# Patient Record
Sex: Female | Born: 1985
Health system: Southern US, Community
[De-identification: ages and names within clinical notes are randomized; demographics above are authoritative.]

## PROBLEM LIST (undated history)

## (undated) DIAGNOSIS — F419 Anxiety disorder, unspecified: Secondary | ICD-10-CM

## (undated) DIAGNOSIS — S82209A Unspecified fracture of shaft of unspecified tibia, initial encounter for closed fracture: Secondary | ICD-10-CM

## (undated) DIAGNOSIS — R112 Nausea with vomiting, unspecified: Secondary | ICD-10-CM

## (undated) DIAGNOSIS — F32A Depression, unspecified: Secondary | ICD-10-CM

## (undated) DIAGNOSIS — F329 Major depressive disorder, single episode, unspecified: Secondary | ICD-10-CM

## (undated) DIAGNOSIS — R2231 Localized swelling, mass and lump, right upper limb: Secondary | ICD-10-CM

## (undated) DIAGNOSIS — Z9889 Other specified postprocedural states: Secondary | ICD-10-CM

## (undated) DIAGNOSIS — Z8489 Family history of other specified conditions: Secondary | ICD-10-CM

## (undated) HISTORY — DX: Unspecified fracture of shaft of unspecified tibia, initial encounter for closed fracture: S82.209A

## (undated) HISTORY — DX: Anxiety disorder, unspecified: F41.9

## (undated) HISTORY — DX: Depression, unspecified: F32.A

## (undated) HISTORY — PX: WISDOM TOOTH EXTRACTION: SHX21

## (undated) HISTORY — DX: Major depressive disorder, single episode, unspecified: F32.9

---
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
## 2003-11-05 ENCOUNTER — Other Ambulatory Visit: Admission: RE | Admit: 2003-11-05 | Discharge: 2003-11-05 | Payer: Self-pay | Admitting: Gynecology

## 2004-11-10 ENCOUNTER — Other Ambulatory Visit: Admission: RE | Admit: 2004-11-10 | Discharge: 2004-11-10 | Payer: Self-pay | Admitting: Gynecology

## 2004-11-13 ENCOUNTER — Encounter: Admission: RE | Admit: 2004-11-13 | Discharge: 2004-11-13 | Payer: Self-pay | Admitting: Internal Medicine

## 2005-04-24 ENCOUNTER — Other Ambulatory Visit: Admission: RE | Admit: 2005-04-24 | Discharge: 2005-04-24 | Payer: Self-pay | Admitting: Gynecology

## 2005-11-11 ENCOUNTER — Other Ambulatory Visit: Admission: RE | Admit: 2005-11-11 | Discharge: 2005-11-11 | Payer: Self-pay | Admitting: Gynecology

## 2006-11-26 ENCOUNTER — Other Ambulatory Visit: Admission: RE | Admit: 2006-11-26 | Discharge: 2006-11-26 | Payer: Self-pay | Admitting: Gynecology

## 2007-03-18 ENCOUNTER — Encounter: Admission: RE | Admit: 2007-03-18 | Discharge: 2007-03-18 | Payer: Self-pay | Admitting: Internal Medicine

## 2007-12-09 ENCOUNTER — Other Ambulatory Visit: Admission: RE | Admit: 2007-12-09 | Discharge: 2007-12-09 | Payer: Self-pay | Admitting: Gynecology

## 2008-12-13 ENCOUNTER — Ambulatory Visit: Payer: Self-pay | Admitting: Women's Health

## 2008-12-13 ENCOUNTER — Encounter: Payer: Self-pay | Admitting: Women's Health

## 2008-12-13 ENCOUNTER — Other Ambulatory Visit: Admission: RE | Admit: 2008-12-13 | Discharge: 2008-12-13 | Payer: Self-pay | Admitting: Gynecology

## 2009-12-20 ENCOUNTER — Other Ambulatory Visit: Admission: RE | Admit: 2009-12-20 | Discharge: 2009-12-20 | Payer: Self-pay | Admitting: Gynecology

## 2009-12-20 ENCOUNTER — Ambulatory Visit: Payer: Self-pay | Admitting: Women's Health

## 2010-07-23 ENCOUNTER — Ambulatory Visit: Payer: Self-pay | Admitting: Women's Health

## 2011-02-13 ENCOUNTER — Other Ambulatory Visit (HOSPITAL_COMMUNITY)
Admission: RE | Admit: 2011-02-13 | Discharge: 2011-02-13 | Disposition: A | Discharge status: Home / Self Care | Payer: BC Managed Care – PPO | Source: Ambulatory Visit | Attending: Gynecology | Admitting: Gynecology

## 2011-02-13 ENCOUNTER — Other Ambulatory Visit: Payer: Self-pay | Admitting: Women's Health

## 2011-02-13 ENCOUNTER — Encounter (INDEPENDENT_AMBULATORY_CARE_PROVIDER_SITE_OTHER): Payer: BC Managed Care – PPO | Admitting: Women's Health

## 2011-02-13 DIAGNOSIS — Z01419 Encounter for gynecological examination (general) (routine) without abnormal findings: Secondary | ICD-10-CM

## 2011-02-13 DIAGNOSIS — R87810 Cervical high risk human papillomavirus (HPV) DNA test positive: Secondary | ICD-10-CM | POA: Insufficient documentation

## 2011-02-13 DIAGNOSIS — Z124 Encounter for screening for malignant neoplasm of cervix: Secondary | ICD-10-CM | POA: Insufficient documentation

## 2011-03-02 ENCOUNTER — Ambulatory Visit: Payer: BC Managed Care – PPO | Admitting: Gynecology

## 2011-03-05 ENCOUNTER — Other Ambulatory Visit: Payer: Self-pay | Admitting: Gynecology

## 2011-03-05 ENCOUNTER — Ambulatory Visit (INDEPENDENT_AMBULATORY_CARE_PROVIDER_SITE_OTHER): Payer: BC Managed Care – PPO | Admitting: Gynecology

## 2011-03-05 DIAGNOSIS — R87810 Cervical high risk human papillomavirus (HPV) DNA test positive: Secondary | ICD-10-CM

## 2011-03-05 DIAGNOSIS — R87610 Atypical squamous cells of undetermined significance on cytologic smear of cervix (ASC-US): Secondary | ICD-10-CM

## 2011-09-22 DIAGNOSIS — F32A Depression, unspecified: Secondary | ICD-10-CM | POA: Insufficient documentation

## 2011-09-22 DIAGNOSIS — F419 Anxiety disorder, unspecified: Secondary | ICD-10-CM | POA: Insufficient documentation

## 2011-09-22 DIAGNOSIS — F329 Major depressive disorder, single episode, unspecified: Secondary | ICD-10-CM | POA: Insufficient documentation

## 2011-09-22 DIAGNOSIS — IMO0001 Reserved for inherently not codable concepts without codable children: Secondary | ICD-10-CM | POA: Insufficient documentation

## 2011-09-22 DIAGNOSIS — L709 Acne, unspecified: Secondary | ICD-10-CM | POA: Insufficient documentation

## 2011-09-25 ENCOUNTER — Other Ambulatory Visit (HOSPITAL_COMMUNITY)
Admission: RE | Admit: 2011-09-25 | Discharge: 2011-09-25 | Disposition: A | Discharge status: Home / Self Care | Payer: BC Managed Care – PPO | Source: Ambulatory Visit | Attending: Women's Health | Admitting: Women's Health

## 2011-09-25 ENCOUNTER — Encounter: Payer: Self-pay | Admitting: Women's Health

## 2011-09-25 ENCOUNTER — Ambulatory Visit (INDEPENDENT_AMBULATORY_CARE_PROVIDER_SITE_OTHER): Payer: BC Managed Care – PPO | Admitting: Women's Health

## 2011-09-25 DIAGNOSIS — Z01419 Encounter for gynecological examination (general) (routine) without abnormal findings: Secondary | ICD-10-CM | POA: Insufficient documentation

## 2011-09-25 DIAGNOSIS — N870 Mild cervical dysplasia: Secondary | ICD-10-CM

## 2011-09-25 NOTE — Progress Notes (Signed)
  Presents for a Pap followup. Pap March of 2012 was ascus with positive HR HPV, C&B April,2012 showed LGSIL CIN-1. Contracepting on Yaz with negative cultures no complaints.  Exam: External genitalia is within normal limits, speculum exam cervix is pink lesion or discharge, Pap taken and will triage based on results. Bimanual no CMT no adnexal fullness or tenderness.

## 2011-10-23 ENCOUNTER — Ambulatory Visit (INDEPENDENT_AMBULATORY_CARE_PROVIDER_SITE_OTHER): Payer: BC Managed Care – PPO | Admitting: Women's Health

## 2011-10-23 ENCOUNTER — Encounter: Payer: Self-pay | Admitting: Women's Health

## 2011-10-23 VITALS — BP 110/70

## 2011-10-23 DIAGNOSIS — R350 Frequency of micturition: Secondary | ICD-10-CM

## 2011-10-23 DIAGNOSIS — R82998 Other abnormal findings in urine: Secondary | ICD-10-CM

## 2011-10-23 DIAGNOSIS — N390 Urinary tract infection, site not specified: Secondary | ICD-10-CM

## 2011-10-23 MED ORDER — SULFAMETHOXAZOLE-TRIMETHOPRIM 800-160 MG PO TABS: 1.0000 | ORAL_TABLET | Freq: Two times a day (BID) | ORAL | Status: AC

## 2011-10-23 NOTE — Progress Notes (Signed)
Addended by: Landis Martins R on: 10/23/2011 11:30 AM   Modules accepted: Orders

## 2011-10-23 NOTE — Progress Notes (Signed)
Patient ID: Patricia Hogan, female   DOB: 04/01/1986, 25 y.o.   MRN: [id number] Presents with complaints of pain, urgency, pressure with urination and discomfort at end of stream. Symptoms started yesterday. Not sexually active. Denies any discharge or fever.  Exam: No CVAT, external genitalia is within normal limits, speculum exam scant pink discharge- cycle due in several days. Bimanual no CMT or adnexal fullness or tenderness, slight discomfort at the suprapubic area. UA 5-10 wbc's and +2 bacteria  Plan: Septra DS one by mouth twice a day for 3 days, check urine culture, UTI prevention discussed. Will call if no relief of symptoms.

## 2011-11-24 HISTORY — PX: BREAST SURGERY: SHX581

## 2012-04-01 ENCOUNTER — Other Ambulatory Visit (HOSPITAL_COMMUNITY)
Admission: RE | Admit: 2012-04-01 | Discharge: 2012-04-01 | Disposition: A | Discharge status: Home / Self Care | Payer: BC Managed Care – PPO | Source: Ambulatory Visit | Attending: Women's Health | Admitting: Women's Health

## 2012-04-01 ENCOUNTER — Ambulatory Visit (INDEPENDENT_AMBULATORY_CARE_PROVIDER_SITE_OTHER): Payer: BC Managed Care – PPO | Admitting: Women's Health

## 2012-04-01 ENCOUNTER — Encounter: Payer: Self-pay | Admitting: Women's Health

## 2012-04-01 VITALS — BP 118/78 | Ht 64.0 in | Wt 172.0 lb

## 2012-04-01 DIAGNOSIS — E079 Disorder of thyroid, unspecified: Secondary | ICD-10-CM

## 2012-04-01 DIAGNOSIS — Z01419 Encounter for gynecological examination (general) (routine) without abnormal findings: Secondary | ICD-10-CM

## 2012-04-01 DIAGNOSIS — B079 Viral wart, unspecified: Secondary | ICD-10-CM

## 2012-04-01 DIAGNOSIS — Z13220 Encounter for screening for lipoid disorders: Secondary | ICD-10-CM

## 2012-04-01 DIAGNOSIS — IMO0001 Reserved for inherently not codable concepts without codable children: Secondary | ICD-10-CM

## 2012-04-01 DIAGNOSIS — Z309 Encounter for contraceptive management, unspecified: Secondary | ICD-10-CM

## 2012-04-01 DIAGNOSIS — Z833 Family history of diabetes mellitus: Secondary | ICD-10-CM

## 2012-04-01 DIAGNOSIS — N870 Mild cervical dysplasia: Secondary | ICD-10-CM

## 2012-04-01 DIAGNOSIS — Z113 Encounter for screening for infections with a predominantly sexual mode of transmission: Secondary | ICD-10-CM

## 2012-04-01 LAB — CBC WITH DIFFERENTIAL/PLATELET
Basophils Absolute: 0 10*3/uL (ref 0.0–0.1)
Eosinophils Relative: 1 % (ref 0–5)
HCT: 42.2 % (ref 36.0–46.0)
Hemoglobin: 14 g/dL (ref 12.0–15.0)
Lymphocytes Relative: 21 % (ref 12–46)
Lymphs Abs: 1.9 10*3/uL (ref 0.7–4.0)
MCH: 30.4 pg (ref 26.0–34.0)
MCV: 91.5 fL (ref 78.0–100.0)
Monocytes Absolute: 0.6 10*3/uL (ref 0.1–1.0)
Monocytes Relative: 7 % (ref 3–12)
Neutro Abs: 6.3 10*3/uL (ref 1.7–7.7)
Platelets: 359 10*3/uL (ref 150–400)
RBC: 4.61 MIL/uL (ref 3.87–5.11)
WBC: 8.9 10*3/uL (ref 4.0–10.5)

## 2012-04-01 LAB — GLUCOSE, RANDOM: Glucose, Bld: 82 mg/dL (ref 70–99)

## 2012-04-01 LAB — LIPID PANEL
Cholesterol: 223 mg/dL — ABNORMAL HIGH (ref 0–200)
LDL Cholesterol: 128 mg/dL — ABNORMAL HIGH (ref 0–99)
Triglycerides: 150 mg/dL — ABNORMAL HIGH (ref <150)
VLDL: 30 mg/dL (ref 0–40)

## 2012-04-01 LAB — RPR

## 2012-04-01 LAB — HEPATITIS B SURFACE ANTIGEN: Hepatitis B Surface Ag: NEGATIVE

## 2012-04-01 LAB — HIV ANTIBODY (ROUTINE TESTING W REFLEX): HIV: NONREACTIVE

## 2012-04-01 LAB — TSH: TSH: 1.578 u[IU]/mL (ref 0.350–4.500)

## 2012-04-01 MED ORDER — DROSPIRENONE-ETHINYL ESTRADIOL 3-0.02 MG PO TABS: 1.0000 | ORAL_TABLET | Freq: Every day | ORAL | Status: DC

## 2012-04-01 MED ORDER — IMIQUIMOD 5 % EX CREA: TOPICAL_CREAM | CUTANEOUS | Status: AC

## 2012-04-01 NOTE — Progress Notes (Signed)
Patricia Hogan 10-17-1986 [id number]    History:    The patient presents for annual exam.  Light 3-4 day monthly cycle on Yaz. Not sexually active for greater than 6 months. Gardasil series completed. History of ascus with positive high risk HPV, C&B showed LGSIL CIN-1 mild dysplasia 02/2011, Paps prior all normal. History of a left breast ruptured implant that was replaced in 2012. Currently on Wellbutrin and Effexor per psychiatrist for anxiety and depression. Complaint of weight gain but has  lost 7 pounds in the past year. Requesting an STD screen, questionable HIV exposure at work.  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  ROS:  A  ROS was performed and pertinent positives and negatives are included in the history.  Exam:  Filed Vitals:   04/01/12 1001  BP: 118/78    General appearance:  Normal Head/Neck:  Normal, without cervical or supraclavicular adenopathy. Thyroid:  Symmetrical, normal in size, without palpable masses or nodularity. Respiratory  Effort:  Normal  Auscultation:  Clear without wheezing or rhonchi Cardiovascular  Auscultation:  Regular rate, without rubs, murmurs or gallops  Edema/varicosities:  Not grossly evident Abdominal  Soft,nontender, without masses, guarding or rebound.  Liver/spleen:  No organomegaly noted  Hernia:  None appreciated  Skin  Inspection:  Grossly normal  Palpation:  Grossly normal Neurologic/psychiatric  Orientation:  Normal with appropriate conversation.  Mood/affect:  Normal  Genitourinary    Breasts: Examined lying and sitting/augmented with saline implants/replaced 9/12.     Right: Without masses, retractions, discharge or axillary adenopathy.     Left: Without masses, retractions, discharge or axillary adenopathy.   Inguinal/mons:   Normal without inguinal adenopathy  External genitalia:  Normal  BUS/Urethra/Skene's glands:  Normal  Bladder:  Normal  Vagina:  Normal  Cervix:  Normal  Uterus:   normal in size, shape and contour.  Midline and mobile  Adnexa/parametria:     Rt: Without masses or tenderness.   Lt: Without masses or tenderness.  Anus and perineum: Normal  Digital rectal exam: Normal sphincter tone without palpated masses or tenderness  Assessment/Plan:  26 y.o. DWF G0 for annual exam requesting STD screen.  History of ascus positive HR HPV/LG SIL/CIN-1 on C&B April 2012 STD screening Anxiety/depression stable on Wellbutrin and Effexor per psychiatrist and counselor  Plan: Contraception reviewed, reviewed slight increased risk for blood clots and strokes on Yaz, states did not do well on other birth control requests to continue. Yaz prescription, proper use, given and reviewed, encourage condoms if become sexually active. SBE's, exercise, calcium rich diet, MVI daily encouraged. CBC, TSH, glucose, lipid profile, UA,  Pap, GC/Chlamydia, HIV, hep B, C., RPR. Encouraged to continue counseling and medications to manage anxiety and depression and reviewed importance of exercise in relationship to mood.         Harrington Challenger [facility], 12:49 PM 04/01/2012

## 2012-04-01 NOTE — Patient Instructions (Signed)

## 2012-04-02 LAB — URINALYSIS W MICROSCOPIC + REFLEX CULTURE
Bacteria, UA: NONE SEEN
Bilirubin Urine: NEGATIVE
Casts: NONE SEEN
Crystals: NONE SEEN
Glucose, UA: NEGATIVE mg/dL
Ketones, ur: NEGATIVE mg/dL
Leukocytes, UA: NEGATIVE
Nitrite: NEGATIVE
Specific Gravity, Urine: 1.023 (ref 1.005–1.030)
Urobilinogen, UA: 0.2 mg/dL (ref 0.0–1.0)
pH: 6 (ref 5.0–8.0)

## 2012-04-02 LAB — GC/CHLAMYDIA PROBE AMP, GENITAL
Chlamydia, DNA Probe: NEGATIVE
GC Probe Amp, Genital: NEGATIVE

## 2013-04-10 ENCOUNTER — Other Ambulatory Visit: Payer: Self-pay | Admitting: Women's Health

## 2013-04-10 ENCOUNTER — Ambulatory Visit (INDEPENDENT_AMBULATORY_CARE_PROVIDER_SITE_OTHER): Payer: BC Managed Care – PPO | Admitting: Women's Health

## 2013-04-10 ENCOUNTER — Other Ambulatory Visit (HOSPITAL_COMMUNITY)
Admission: RE | Admit: 2013-04-10 | Discharge: 2013-04-10 | Disposition: A | Discharge status: Home / Self Care | Payer: BC Managed Care – PPO | Source: Ambulatory Visit | Attending: Family Medicine | Admitting: Family Medicine

## 2013-04-10 ENCOUNTER — Encounter: Payer: Self-pay | Admitting: Women's Health

## 2013-04-10 VITALS — BP 120/76 | Ht 64.0 in | Wt 182.0 lb

## 2013-04-10 DIAGNOSIS — Z309 Encounter for contraceptive management, unspecified: Secondary | ICD-10-CM

## 2013-04-10 DIAGNOSIS — E079 Disorder of thyroid, unspecified: Secondary | ICD-10-CM

## 2013-04-10 DIAGNOSIS — Z01419 Encounter for gynecological examination (general) (routine) without abnormal findings: Secondary | ICD-10-CM | POA: Insufficient documentation

## 2013-04-10 DIAGNOSIS — IMO0001 Reserved for inherently not codable concepts without codable children: Secondary | ICD-10-CM

## 2013-04-10 DIAGNOSIS — B373 Candidiasis of vulva and vagina: Secondary | ICD-10-CM

## 2013-04-10 LAB — URINALYSIS W MICROSCOPIC + REFLEX CULTURE
Bilirubin Urine: NEGATIVE
Casts: NONE SEEN
Crystals: NONE SEEN
Glucose, UA: NEGATIVE mg/dL
Nitrite: NEGATIVE
Protein, ur: NEGATIVE mg/dL
Specific Gravity, Urine: 1.016 (ref 1.005–1.030)
pH: 5.5 (ref 5.0–8.0)

## 2013-04-10 LAB — TSH: TSH: 1.67 u[IU]/mL (ref 0.350–4.500)

## 2013-04-10 LAB — WET PREP FOR TRICH, YEAST, CLUE
Trich, Wet Prep: NONE SEEN
Yeast Wet Prep HPF POC: NONE SEEN

## 2013-04-10 MED ORDER — NYSTATIN-TRIAMCINOLONE 100000-0.1 UNIT/GM-% EX OINT: TOPICAL_OINTMENT | Freq: Two times a day (BID) | CUTANEOUS | Status: DC

## 2013-04-10 MED ORDER — DROSPIRENONE-ETHINYL ESTRADIOL 3-0.02 MG PO TABS: 1.0000 | ORAL_TABLET | Freq: Every day | ORAL | Status: DC

## 2013-04-10 MED ORDER — FLUCONAZOLE 150 MG PO TABS: 150.0000 mg | ORAL_TABLET | Freq: Once | ORAL | Status: DC

## 2013-04-10 NOTE — Addendum Note (Signed)
Addended by: Dayna Barker on: 04/10/2013 10:38 AM   Modules accepted: Orders

## 2013-04-10 NOTE — Patient Instructions (Addendum)

## 2013-04-10 NOTE — Progress Notes (Signed)
Patricia Hogan 10/13/1986 [id number]    History:    The patient presents for annual exam.  Monthly cycle on Yaz/not sexually active. Ascus with positive HR HPV with a C&B showing CIN-1 in 2012 with normal Paps after. History of a right breast implant rupture in 2012/ had both implants replaced. Anxiety and depression on Wellbutrin and Effexor per psychiatrist. gardasil series completed 2007.   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 Going on a two-week [place] vacation.   ROS:  A  ROS was performed and pertinent positives and negatives are included in the history.  Exam:  Filed Vitals:   04/10/13 0931  BP: 120/76    General appearance:  Normal Head/Neck:  Normal, without cervical or supraclavicular adenopathy. Thyroid:  Symmetrical, normal in size, without palpable masses or nodularity. Respiratory  Effort:  Normal  Auscultation:  Clear without wheezing or rhonchi Cardiovascular  Auscultation:  Regular rate, without rubs, murmurs or gallops  Edema/varicosities:  Not grossly evident Abdominal  Soft,nontender, without masses, guarding or rebound.  Liver/spleen:  No organomegaly noted  Hernia:  None appreciated  Skin  Inspection:  Grossly normal  Palpation:  Grossly normal Neurologic/psychiatric  Orientation:  Normal with appropriate conversation.  Mood/affect:  Normal  Genitourinary    Breasts: Examined lying and sitting.     Right: Without masses, retractions, discharge or axillary adenopathy.     Left: Without masses, retractions, discharge or axillary adenopathy.   Inguinal/mons:  Normal without inguinal adenopathy  External genitalia:  Normal  BUS/Urethra/Skene's glands:  Normal  Bladder:  Normal  Vagina:  Normal  Cervix:  Normal  Uterus:   normal in size, shape and contour.  Midline and mobile  Adnexa/parametria:     Rt: Without  masses or tenderness.   Lt: Without masses or tenderness.  Anus and perineum: Normal  Digital rectal exam: Normal sphincter tone without palpated masses or tenderness  Assessment/Plan:  27 y.o. DWF G0 for annual exam with complaint of vaginal itching and difficulty losing weight with diet and exercise.  Wet prep negative/clinical yeast Positive HR HPV/LGSIL 2012  Anxiety/depression-meds and counseling psychiatrist  Plan: CBC, TSH, glucose, UA, Pap. SBE's, continue regular exercise, decreasing calories for weight loss, calcium rich foods, MVI daily encouraged. Diflucan 150 by mouth x1 dose, nystatin externally as needed. Condoms encouraged if become sexually active. Yaz one by mouth daily for cycle control and acne. Reviewed slight risk for blood clots and strokes, slightly higher than other progestins and accepts.    Harrington Challenger [facility], 10:17 AM 04/10/2013

## 2013-04-11 ENCOUNTER — Encounter: Payer: Self-pay | Admitting: Gynecology

## 2013-04-11 LAB — URINE CULTURE
Colony Count: NO GROWTH
Organism ID, Bacteria: NO GROWTH

## 2013-08-30 ENCOUNTER — Ambulatory Visit (INDEPENDENT_AMBULATORY_CARE_PROVIDER_SITE_OTHER): Payer: BC Managed Care – PPO | Admitting: Emergency Medicine

## 2013-08-30 VITALS — BP 118/70 | HR 107 | Temp 98.9°F | Resp 20 | Ht 64.0 in | Wt 187.2 lb

## 2013-08-30 DIAGNOSIS — W57XXXA Bitten or stung by nonvenomous insect and other nonvenomous arthropods, initial encounter: Secondary | ICD-10-CM

## 2013-08-30 DIAGNOSIS — T148 Other injury of unspecified body region: Secondary | ICD-10-CM

## 2013-08-30 MED ORDER — DOXYCYCLINE HYCLATE 100 MG PO CAPS: 100.0000 mg | ORAL_CAPSULE | Freq: Two times a day (BID) | ORAL | Status: DC

## 2013-08-30 NOTE — Patient Instructions (Signed)
Lyme Disease  You may have been bitten by a tick and are to watch for the development of Lyme Disease. Lyme Disease is an infection that is caused by a bacteria The bacteria causing this disease is named Borreilia burgdorferi. If a tick is infected with this bacteria and then bites you, then Lyme Disease may occur. These ticks are carried by deer and rodents such as rabbits and mice and infest grassy as well as forested areas. Fortunately most tick bites do not cause Lyme Disease.   Lyme Disease is easier to prevent than to treat. First, covering your legs with clothing when walking in areas where ticks are possibly abundant will prevent their attachment because ticks tend to stay within inches of the ground. Second, using insecticides containing DEET can be applied on skin or clothing. Last, because it takes about 12 to 24 hours for the tick to transmit the disease after attachment to the human host, you should inspect your body for ticks twice a day when you are in areas where Lyme Disease is common. You must look thoroughly when searching for ticks. The Ixodes tick that carries Lyme Disease is very small. It is around the size of a sesame seed (picture of tick is not actual size). Removal is best done by grasping the tick by the head and pulling it out. Do not to squeeze the body of the tick. This could inject the infecting bacteria into the bite site. Wash the area of the bite with an antiseptic solution after removal.   Lyme Disease is a disease that may affect many body systems. Because of the small size of the biting tick, most people do not notice being bitten. The first sign of an infection is usually a round red rash that extends out from the center of the tick bite. The center of the lesion may be blood colored (hemorrhagic) or have tiny blisters (vesicular). Most lesions have bright red outer borders and partial central clearing. This rash may extend out many inches in diameter, and multiple lesions may  be present. Other symptoms such as fatigue, headaches, chills and fever, general achiness and swelling of lymph glands may also occur. If this first stage of the disease is left untreated, these symptoms may gradually resolve by themselves, or progressive symptoms may occur because of spread of infection to other areas of the body.   Follow up with your caregiver to have testing and treatment if you have a tick bite and you develop any of the above complaints. Your caregiver may recommend preventative (prophylactic) medications which kill bacteria (antibiotics). Once a diagnosis of Lyme Disease is made, antibiotic treatment is highly likely to cure the disease. Effective treatment of late stage Lyme Disease may require longer courses of antibiotic therapy.   MAKE SURE YOU:   · Understand these instructions.  · Will watch your condition.  · Will get help right away if you are not doing well or get worse.  Document Released: 02/15/2001 Document Revised: 02/01/2012 Document Reviewed: 04/19/2009  ExitCare® Patient Information ©2014 [employer].

## 2013-08-30 NOTE — Progress Notes (Signed)
[facility] [address], [place] [address] [phone]  Date:  08/30/2013   Name:  Patricia Hogan   DOB:  27-Sep-1986   MRN:  [id number]  PCP:  Gwen Pounds, MD    Chief Complaint: Fatigue, Headache and Nausea   History of Present Illness:  Patricia Hogan is a 27 y.o. very pleasant female patient who presents with the following:  Noted an erythematous lesion on left medial mid thigh.  Thinks she was bitten by something.  No itching.  Tender and painful.  Increased rash.  Has arthralgias and myalgias.  No fever or chills.  No improvement with over the counter medications or other home remedies. Denies other complaint or health concern today.   Patient Active Problem List   Diagnosis Date Noted  . LG SIL/CIN-1 on colposcopy with biopsy 02/2011   . Depression   . Anxiety   . Acne     Past Medical History  Diagnosis Date  . ASCUS (atypical squamous cells of undetermined significance) on Pap smear 02/2011    POSITIVE HIGH RISK HPV --C&B  . Depression   . Anxiety   . Acne     Past Surgical History  Procedure Laterality Date  . Augmentation mammaplasty  2004  . Breast surgery  9-12    R IMPLANT RUPTURED HAD BOTH IMPLANTS  REPLACED  . Colposcopy    . Wisdom tooth extraction      History  Substance Use Topics  . Smoking status: Never Smoker   . Smokeless tobacco: Never Used  . Alcohol Use: No    Family History  Problem Relation Age of Onset  . Diabetes Brother     JODM  . Hypertension Maternal Grandmother   . Diabetes Maternal Grandfather   . Breast cancer Paternal Grandmother 23    Allergies  Allergen Reactions  . Other     SEASONAL ONLY    Medication list has been reviewed and updated.  Current Outpatient Prescriptions on File Prior to Visit  Medication Sig Dispense Refill  . ALPRAZolam (XANAX) 0.5 MG tablet Take 0.5 mg by mouth at bedtime as needed.        Marland Kitchen BIOTIN PO Take by mouth.      Marland Kitchen buPROPion (WELLBUTRIN XL) 300 MG  24 hr tablet Take 300 mg by mouth daily.      . drospirenone-ethinyl estradiol (YAZ,GIANVI,LORYNA) 3-0.02 MG tablet Take 1 tablet by mouth daily.  1 Package  12  . Loratadine (CLARITIN PO) Take by mouth.      . ValACYclovir HCl (VALTREX PO) Take by mouth.        . venlafaxine (EFFEXOR) 75 MG tablet Take 75 mg by mouth 2 (two) times daily.        . ferrous sulfate 325 (65 FE) MG tablet Take 325 mg by mouth daily with breakfast.        . fluconazole (DIFLUCAN) 150 MG tablet Take 1 tablet (150 mg total) by mouth once.  1 tablet  1  . nystatin-triamcinolone ointment (MYCOLOG) Apply topically 2 (two) times daily.  30 g  0   No current facility-administered medications on file prior to visit.    Review of Systems:  As per HPI, otherwise negative.    Physical Examination: Filed Vitals:   08/30/13 1341  BP: 118/70  Pulse: 107  Temp: 98.9 F (37.2 C)  Resp: 20   Filed Vitals:   08/30/13 1341  Height: 5\' 4"  (1.626 m)  Weight: 187 lb 3.2 oz (84.913 kg)   Body mass index is 32.12 kg/(m^2). Ideal Body Weight: Weight in (lb) to have BMI = 25: 145.3   GEN: WDWN, NAD, Non-toxic, Alert & Oriented x 3 HEENT: Atraumatic, Normocephalic.  Ears and Nose: No external deformity. EXTR: No clubbing/cyanosis/edema NEURO: Normal gait.  PSYCH: Normally interactive. Conversant. Not depressed or anxious appearing.  Calm demeanor.  Left thigh:  Erythematous tender, warm lesion on medial thigh.  Assessment and Plan: Concern about lyme Labs Doxy   Signed,  Phillips Odor, MD

## 2013-08-31 LAB — LYME ABY, WSTRN BLT IGG & IGM W/BANDS
B burgdorferi IgG Abs (IB): NEGATIVE
B burgdorferi IgM Abs (IB): NEGATIVE
Lyme Disease 28 kD IgG: NONREACTIVE
Lyme Disease 30 kD IgG: NONREACTIVE
Lyme Disease 45 kD IgG: NONREACTIVE
Lyme Disease 58 kD IgG: NONREACTIVE
Lyme Disease 66 kD IgG: NONREACTIVE
Lyme Disease 93 kD IgG: NONREACTIVE

## 2013-08-31 LAB — ROCKY MTN SPOTTED FVR AB, IGM-BLOOD: ROCKY MTN SPOTTED FEVER, IGM: 0.38 IV

## 2013-09-01 LAB — EHRLICHIA ANTIBODY PANEL
E chaffeensis (HGE) Ab, IgG: <1:64 {titer}
E chaffeensis (HGE) Ab, IgM: <1:20 {titer}

## 2013-09-04 ENCOUNTER — Ambulatory Visit (INDEPENDENT_AMBULATORY_CARE_PROVIDER_SITE_OTHER): Payer: BC Managed Care – PPO | Admitting: Family Medicine

## 2013-09-04 ENCOUNTER — Telehealth: Payer: Self-pay

## 2013-09-04 VITALS — BP 108/72 | HR 71 | Temp 98.7°F | Resp 18 | Wt 187.0 lb

## 2013-09-04 DIAGNOSIS — R509 Fever, unspecified: Secondary | ICD-10-CM

## 2013-09-04 DIAGNOSIS — J069 Acute upper respiratory infection, unspecified: Secondary | ICD-10-CM

## 2013-09-04 LAB — POCT UA - MICROSCOPIC ONLY
Casts, Ur, LPF, POC: NEGATIVE
Mucus, UA: NEGATIVE
Yeast, UA: NEGATIVE

## 2013-09-04 LAB — POCT URINALYSIS DIPSTICK
Bilirubin, UA: NEGATIVE
Glucose, UA: NEGATIVE
Ketones, UA: NEGATIVE
Protein, UA: NEGATIVE
Spec Grav, UA: <=1.005
pH, UA: 5.5

## 2013-09-04 LAB — POCT CBC
Granulocyte percent: 77.7 %G (ref 37–80)
HCT, POC: 43.3 % (ref 37.7–47.9)
Hemoglobin: 13.9 g/dL (ref 12.2–16.2)
Lymph, poc: 1.8 (ref 0.6–3.4)
MCH, POC: 30.8 pg (ref 27–31.2)
MCHC: 32.1 g/dL (ref 31.8–35.4)
MCV: 95.7 fL (ref 80–97)
MID (cbc): 0.6 (ref 0–0.9)
MPV: 9.2 fL (ref 0–99.8)
POC LYMPH PERCENT: 16.7 %L (ref 10–50)
POC MID %: 5.6 %M (ref 0–12)
Platelet Count, POC: 303 10*3/uL (ref 142–424)
RBC: 4.52 M/uL (ref 4.04–5.48)
RDW, POC: 12 %
WBC: 10.5 10*3/uL — AB (ref 4.6–10.2)

## 2013-09-04 LAB — COMPREHENSIVE METABOLIC PANEL
ALT: 14 U/L (ref 0–35)
AST: 17 U/L (ref 0–37)
Alkaline Phosphatase: 103 U/L (ref 39–117)
BUN: 12 mg/dL (ref 6–23)
Creat: 1.06 mg/dL (ref 0.50–1.10)
Total Bilirubin: 0.5 mg/dL (ref 0.3–1.2)

## 2013-09-04 LAB — POCT SEDIMENTATION RATE: POCT SED RATE: 21 mm/hr (ref 0–22)

## 2013-09-04 LAB — C-REACTIVE PROTEIN: CRP: 1 mg/dL — ABNORMAL HIGH (ref <0.60)

## 2013-09-04 MED ORDER — FLUTICASONE PROPIONATE 50 MCG/ACT NA SUSP: 2.0000 | Freq: Every day | NASAL | Status: DC

## 2013-09-04 MED ORDER — HYDROCODONE-HOMATROPINE 5-1.5 MG/5ML PO SYRP: 5.0000 mL | ORAL_SOLUTION | Freq: Three times a day (TID) | ORAL | Status: DC | PRN

## 2013-09-04 MED ORDER — IPRATROPIUM BROMIDE 0.03 % NA SOLN: 2.0000 | Freq: Four times a day (QID) | NASAL | Status: DC

## 2013-09-04 NOTE — Progress Notes (Addendum)
Subjective:  This chart was scribed for Patricia Sorenson, MD by Quintella Reichert, ED scribe.  This patient was seen in room [facility] Room 10 and the patient's care was started at 12:18 PM.   Patient ID: [name], female    DOB: 1986/03/02, 27 y.o.   MRN: [id number]  Chief Complaint  Patient presents with  . Follow-up    tick bite, continues to have headache and fever, just is not feeling better     HPI  HPI Comments: [name] is a 27 y.o. female who presents with a chief complaint of flu-like symptoms subsequent to an insect bite.  Pt states that one week ago she developed a tender, circular area of mild erythema with central darkening to her left thigh (viewed on pt's phone) which she initially attributed to a spider bit.  She also developed headaches, generalized myalgias, fatigue, and intermittent fever up to 100 F which she noted 1-2 days after the bite.  She was seen here on 10/8 by Dr. Dareen Piano.  Her labs were normal at that visit and she was placed on Doxycycline and advised to return if symptoms did not improve.  Since then she states she still feels "sluggish" and has continued to experience bilateral occipital and temporal headaches.  She also notes some mild neck stiffness and neck pain.  She denies photophobia.   She also states that since her last visit she has also developed nasal congestion, cough, chest congestion, sore throat, swollen lymph nodes, and bilateral ear pain.  She also complains of mild SOB and wheezing and states "my chest is more gunky."  Cough is occasionally productive of greenish mucus.  She states "I can't hear, feel like there's water in my ears."  She also states her stomach hurts slightly.  She denies nausea, vomiting, or CP. Urine is also very yellow and she states she has been staying well-hydrated.  She denies dysuria, frequency, urgency, hematuria or any other urinary symptoms.   The redness at her bite site has resolved but she now notes some bruising to  the area.  She denies rash to any other area.  Pt has attempted to treat symptoms with Sudafed, without relief.      Past Medical History  Diagnosis Date  . ASCUS (atypical squamous cells of undetermined significance) on Pap smear 02/2011    POSITIVE HIGH RISK HPV --C&B  . Depression   . Anxiety   . Acne       Medication List       This list is accurate as of: 09/04/13 12:18 PM.  Always use your most recent med list.               ALPRAZolam 0.5 MG tablet  Commonly known as:  XANAX  Take 0.5 mg by mouth at bedtime as needed.     BIOTIN PO  Take by mouth.     buPROPion 300 MG 24 hr tablet  Commonly known as:  WELLBUTRIN XL  Take 300 mg by mouth daily.     CLARITIN PO  Take by mouth.     doxycycline 100 MG capsule  Commonly known as:  VIBRAMYCIN  Take 1 capsule (100 mg total) by mouth 2 (two) times daily.     drospirenone-ethinyl estradiol 3-0.02 MG tablet  Commonly known as:  YAZ,GIANVI,LORYNA  Take 1 tablet by mouth daily.     ferrous sulfate 325 (65 FE) MG tablet  Take 325 mg by mouth daily with breakfast.  fluconazole 150 MG tablet  Commonly known as:  DIFLUCAN  Take 1 tablet (150 mg total) by mouth once.     nystatin-triamcinolone ointment  Commonly known as:  MYCOLOG  Apply topically 2 (two) times daily.     VALTREX PO  Take by mouth.     venlafaxine 75 MG tablet  Commonly known as:  EFFEXOR  Take 75 mg by mouth 2 (two) times daily.       Allergies  Allergen Reactions  . Other     SEASONAL ONLY    Review of Systems  Constitutional: Positive for fever and fatigue.  HENT: Positive for congestion, ear pain and sore throat.   Eyes: Negative for photophobia.  Respiratory: Positive for cough, shortness of breath and wheezing.   Cardiovascular: Negative for chest pain.  Gastrointestinal: Positive for abdominal pain. Negative for nausea and vomiting.  Genitourinary: Negative for dysuria, urgency, frequency, hematuria and difficulty  urinating.       Urine is "very yellow"  Musculoskeletal: Positive for myalgias, neck pain and neck stiffness.  Skin:       Insect bite  Neurological: Positive for headaches.  Hematological: Positive for adenopathy.     BP 108/72  Pulse 71  Temp(Src) 98.7 F (37.1 C) (Oral)  Resp 18  Wt 187 lb (84.823 kg)  BMI 32.08 kg/m2  SpO2 98%  LMP 08/24/2013  Objective:   Physical Exam  Nursing note and vitals reviewed. Constitutional: She is oriented to person, place, and time. She appears well-developed and well-nourished. No distress.  HENT:  Head: Normocephalic and atraumatic.  Right Ear: Tympanic membrane is injected. A middle ear effusion is present.  Left Ear: A middle ear effusion is present.  Nose: Rhinorrhea present. No mucosal edema.  Mouth/Throat: Uvula is midline and mucous membranes are normal. No trismus in the jaw. Posterior oropharyngeal edema (minimal) present. No oropharyngeal exudate or posterior oropharyngeal erythema.  Moist mucous membranes No sinus tenderness  Eyes: EOM are normal.  Neck: Neck supple. No tracheal deviation present.  Cardiovascular: Normal rate, regular rhythm, S1 normal and S2 normal.   Pulmonary/Chest: Effort normal and breath sounds normal. No respiratory distress. She has no wheezes. She has no rhonchi. She has no rales.  Abdominal: Soft. Bowel sounds are normal. She exhibits no distension. There is tenderness in the right upper quadrant and left lower quadrant. There is no CVA tenderness and negative Murphy's sign.  Musculoskeletal: Normal range of motion.  Lymphadenopathy:    She has cervical adenopathy.  Anterior cervical adenopathy Posterior cervical adenopathy  Neurological: She is alert and oriented to person, place, and time.  Skin: Skin is warm and dry.  Less than 5-mm subq nodule palpable with mild scaling, subtle bruising surrounding  Psychiatric: She has a normal mood and affect. Her behavior is normal.    Results for orders  placed in visit on 09/04/13  POCT CBC      Result Value Range   WBC 10.5 (*) 4.6 - 10.2 K/uL   Lymph, poc 1.8  0.6 - 3.4   POC LYMPH PERCENT 16.7  10 - 50 %L   MID (cbc) 0.6  0 - 0.9   POC MID % 5.6  0 - 12 %M   POC Granulocyte 8.2 (*) 2 - 6.9   Granulocyte percent 77.7  37 - 80 %G   RBC 4.52  4.04 - 5.48 M/uL   Hemoglobin 13.9  12.2 - 16.2 g/dL   HCT, POC 16.1  09.6 - 47.9 %  MCV 95.7  80 - 97 fL   MCH, POC 30.8  27 - 31.2 pg   MCHC 32.1  31.8 - 35.4 g/dL   RDW, POC 16.1     Platelet Count, POC 303  142 - 424 K/uL   MPV 9.2  0 - 99.8 fL  POCT URINALYSIS DIPSTICK      Result Value Range   Color, UA yellow     Clarity, UA clear     Glucose, UA neg     Bilirubin, UA neg     Ketones, UA neg     Spec Grav, UA <=1.005     Blood, UA small     pH, UA 5.5     Protein, UA neg     Urobilinogen, UA 0.2     Nitrite, UA neg     Leukocytes, UA Negative    POCT UA - MICROSCOPIC ONLY      Result Value Range   WBC, Ur, HPF, POC 6-11     RBC, urine, microscopic 3-6     Bacteria, U Microscopic 2+     Mucus, UA neg     Epithelial cells, urine per micros 16-24     Crystals, Ur, HPF, POC neg     Casts, Ur, LPF, POC neg     Yeast, UA neg           Assessment & Plan:  Fever, unspecified - Plan: POCT CBC, POCT SEDIMENTATION RATE, [place] spotted fvr ab, IgM-blood, POCT urinalysis dipstick, POCT UA - Microscopic Only, Comprehensive metabolic panel, Epstein-Barr virus VCA antibody panel, C-reactive protein  Acute URI  I suspect that her development of URI sxs is just coincidental to her bug bite - unknown if it was a tick - pt actually thought it was a spider bite - no insect was ever seen.  This lesion seems to have healed entirely and she has no other rashes. Her exam is very c/w benign viral URI so will proceed with symptomatic treatment and watchful waiting. Finish course of doxy. RTC immed if worsening - warned of headache, photophobia, neck stiffness, myalgias/arthralgies, rash,  etc. Meds ordered this encounter  Medications  . fluticasone (FLONASE) 50 MCG/ACT nasal spray    Sig: Place 2 sprays into the nose daily.    Dispense:  16 g    Refill:  6  . ipratropium (ATROVENT) 0.03 % nasal spray    Sig: Place 2 sprays into the nose 4 (four) times daily.    Dispense:  30 mL    Refill:  1  . HYDROcodone-homatropine (HYCODAN) 5-1.5 MG/5ML syrup    Sig: Take 5 mLs by mouth every 8 (eight) hours as needed for cough.    Dispense:  120 mL    Refill:  0

## 2013-09-04 NOTE — Patient Instructions (Signed)
Hot showers or breathing in steam may help loosen the congestion.  Using a netti pot or sinus rinse is also likely to help you feel better and keep this from progressing.  Use the atrovent nasal spray as needed throughout the day and use the fluticasone nasal spray every night before bed for at least 2 weeks.  I recommend augmenting with 12 hr sudafed (behind the counter) and generic mucinex to help you move out the congestion.  If no improvement or you are getting worse, come back as you might need a course of steroids but hopefully with all of the above, you can avoid it.   Upper Respiratory Infection, Adult An upper respiratory infection (URI) is also known as the common cold. It is often caused by a type of germ (virus). Colds are easily spread (contagious). You can pass it to others by kissing, coughing, sneezing, or drinking out of the same glass. Usually, you get better in 1 or 2 weeks.  HOME CARE   Only take medicine as told by your doctor.  Use a warm mist humidifier or breathe in steam from a hot shower.  Drink enough water and fluids to keep your pee (urine) clear or pale yellow.  Get plenty of rest.  Return to work when your temperature is back to normal or as told by your doctor. You may use a face mask and wash your hands to stop your cold from spreading. GET HELP RIGHT AWAY IF:   After the first few days, you feel you are getting worse.  You have questions about your medicine.  You have chills, shortness of breath, or brown or red spit (mucus).  You have yellow or brown snot (nasal discharge) or pain in the face, especially when you bend forward.  You have a fever, puffy (swollen) neck, pain when you swallow, or white spots in the back of your throat.  You have a bad headache, ear pain, sinus pain, or chest pain.  You have a high-pitched whistling sound when you breathe in and out (wheezing).  You have a lasting cough or cough up blood.  You have sore muscles or a  stiff neck. MAKE SURE YOU:   Understand these instructions.  Will watch your condition.  Will get help right away if you are not doing well or get worse. Document Released: 04/27/2008 Document Revised: 02/01/2012 Document Reviewed: 03/16/2011 ExitCare Patient Information 2014 [employer].  

## 2013-09-04 NOTE — Telephone Encounter (Signed)
Labs negative. She should return to clinic if she is not feeling better. I called her to advise.

## 2013-09-04 NOTE — Telephone Encounter (Signed)
PT STATES SHE WAS SEEN FOR A TICK BITE AND GIVEN HER LAB RESULTS. HOWEVER SHE IS STILL FEEING REALLY BAD AND WOULD LIKE TO SPEAK WITH SOMEONE PLEASE CALL [phone]

## 2013-09-05 LAB — EPSTEIN-BARR VIRUS VCA ANTIBODY PANEL
EBV EA IgG: <5 U/mL (ref <9.0)
EBV NA IgG: 581 U/mL — ABNORMAL HIGH (ref <18.0)
EBV VCA IgG: 345 U/mL — ABNORMAL HIGH (ref <18.0)

## 2013-09-07 ENCOUNTER — Ambulatory Visit (INDEPENDENT_AMBULATORY_CARE_PROVIDER_SITE_OTHER): Payer: BC Managed Care – PPO | Admitting: Family Medicine

## 2013-09-07 DIAGNOSIS — B373 Candidiasis of vulva and vagina: Secondary | ICD-10-CM

## 2013-09-07 DIAGNOSIS — H6690 Otitis media, unspecified, unspecified ear: Secondary | ICD-10-CM

## 2013-09-07 DIAGNOSIS — H6692 Otitis media, unspecified, left ear: Secondary | ICD-10-CM

## 2013-09-07 DIAGNOSIS — R509 Fever, unspecified: Secondary | ICD-10-CM

## 2013-09-07 LAB — COMPREHENSIVE METABOLIC PANEL
ALT: 13 U/L (ref 0–35)
Albumin: 4.5 g/dL (ref 3.5–5.2)
CO2: 31 mEq/L (ref 19–32)
Calcium: 10.4 mg/dL (ref 8.4–10.5)
Chloride: 100 mEq/L (ref 96–112)
Creat: 0.95 mg/dL (ref 0.50–1.10)
Glucose, Bld: 83 mg/dL (ref 70–99)
Potassium: 5.1 mEq/L (ref 3.5–5.3)
Sodium: 137 mEq/L (ref 135–145)
Total Bilirubin: 0.4 mg/dL (ref 0.3–1.2)
Total Protein: 7.8 g/dL (ref 6.0–8.3)

## 2013-09-07 LAB — POCT URINALYSIS DIPSTICK
Glucose, UA: NEGATIVE
Ketones, UA: NEGATIVE
Nitrite, UA: NEGATIVE
Protein, UA: NEGATIVE
Spec Grav, UA: 1.015
Urobilinogen, UA: 0.2
pH, UA: 7

## 2013-09-07 LAB — POCT CBC
Granulocyte percent: 63.9 %G (ref 37–80)
HCT, POC: 46.8 % (ref 37.7–47.9)
Hemoglobin: 15 g/dL (ref 12.2–16.2)
Lymph, poc: 2.4 (ref 0.6–3.4)
MCH, POC: 30.8 pg (ref 27–31.2)
MCHC: 32.1 g/dL (ref 31.8–35.4)
MCV: 96 fL (ref 80–97)
MID (cbc): 0.5 (ref 0–0.9)
POC Granulocyte: 5.2 (ref 2–6.9)
POC MID %: 6.1 %M (ref 0–12)
Platelet Count, POC: 401 10*3/uL (ref 142–424)
RBC: 4.87 M/uL (ref 4.04–5.48)
WBC: 8.1 10*3/uL (ref 4.6–10.2)

## 2013-09-07 LAB — POCT UA - MICROSCOPIC ONLY: Casts, Ur, LPF, POC: NEGATIVE

## 2013-09-07 MED ORDER — FLUCONAZOLE 150 MG PO TABS: 150.0000 mg | ORAL_TABLET | Freq: Once | ORAL | Status: DC

## 2013-09-07 MED ORDER — METHYLPREDNISOLONE ACETATE 80 MG/ML IJ SUSP
120.0000 mg | Freq: Once | INTRAMUSCULAR | Status: AC
  Administered 2013-09-07: 120 mg via INTRAMUSCULAR

## 2013-09-07 MED ORDER — CEFDINIR 300 MG PO CAPS: 300.0000 mg | ORAL_CAPSULE | Freq: Two times a day (BID) | ORAL | Status: DC

## 2013-09-09 LAB — URINE CULTURE: Colony Count: NO GROWTH

## 2013-09-13 NOTE — Progress Notes (Signed)
Subjective:    Patient ID: Patricia Hogan, female    DOB: [birth year], 27 y.o.   MRN: [id number]  Chief Complaint  Patient presents with  . Follow-up    fever  . Otalgia    (L) ear   Otalgia  Associated symptoms include coughing, headaches, hearing loss and a sore throat. Pertinent negatives include no rash.   Patricia Hogan is a 27 y.o. female who presents to [facility] complaining of left-sided otalgia with associated "popping" in the ear and hearing loss that sounds "like static." Patient reports that she is still currently taking the medications prescribed during her last encounter (doxycycline, 100 mg, 2x per day). She states that she is still experiencing some congestion with associated cough with yellow sputum and chest tightness. She reports associated fever (99-100, measured at home), chills, temporal headache, and mild sore throat. Patient also reports an insect bite to the inner left thigh that is healing well and is without complication. She denies any rashes, tinnitus, or urinary symptoms. Prior labs show pt has a history of mononucleosis but no current infection.   Patient is a Armed forces operational officer.  Past Medical History  Diagnosis Date  . ASCUS (atypical squamous cells of undetermined significance) on Pap smear 02/2011    POSITIVE HIGH RISK HPV --C&B  . Depression   . Anxiety   . Acne    Current Outpatient Prescriptions on File Prior to Visit  Medication Sig Dispense Refill  . BIOTIN PO Take by mouth.      Marland Kitchen buPROPion (WELLBUTRIN XL) 300 MG 24 hr tablet Take 300 mg by mouth daily.      . drospirenone-ethinyl estradiol (YAZ,GIANVI,LORYNA) 3-0.02 MG tablet Take 1 tablet by mouth daily.  1 Package  12  . ferrous sulfate 325 (65 FE) MG tablet Take 325 mg by mouth daily with breakfast.        . Loratadine (CLARITIN PO) Take by mouth.      . ALPRAZolam (XANAX) 0.5 MG tablet Take 0.5 mg by mouth at bedtime as needed.        . doxycycline (VIBRAMYCIN) 100 MG  capsule Take 1 capsule (100 mg total) by mouth 2 (two) times daily.  20 capsule  1  . fluticasone (FLONASE) 50 MCG/ACT nasal spray Place 2 sprays into the nose daily.  16 g  6  . HYDROcodone-homatropine (HYCODAN) 5-1.5 MG/5ML syrup Take 5 mLs by mouth every 8 (eight) hours as needed for cough.  120 mL  0  . ipratropium (ATROVENT) 0.03 % nasal spray Place 2 sprays into the nose 4 (four) times daily.  30 mL  1  . nystatin-triamcinolone ointment (MYCOLOG) Apply topically 2 (two) times daily.  30 g  0  . ValACYclovir HCl (VALTREX PO) Take by mouth.        . venlafaxine (EFFEXOR) 75 MG tablet Take 75 mg by mouth 2 (two) times daily.         No current facility-administered medications on file prior to visit.   Allergies  Allergen Reactions  . Other     SEASONAL ONLY   Review of Systems  Constitutional: Positive for fever and chills.  HENT: Positive for congestion, ear pain, hearing loss and sore throat. Negative for tinnitus.   Respiratory: Positive for cough and chest tightness.   Endocrine: Negative for polyuria.  Genitourinary: Negative for dysuria, urgency, frequency, hematuria and decreased urine volume.  Skin: Negative for rash.  Neurological: Positive for headaches.  LMP 08/24/2013 Objective:   Physical Exam  Nursing note and vitals reviewed. Constitutional: She is oriented to person, place, and time. She appears well-developed and well-nourished. No distress.  HENT:  Head: Normocephalic and atraumatic.  Nose: Nose normal.  Mouth/Throat: Oropharynx is clear and moist.  Left TM is erythematous and injected. Right TM has some mild effusion.   Eyes: Conjunctivae are normal.  Neck: Normal range of motion. Neck supple.  Cardiovascular: Normal rate, regular rhythm and normal heart sounds.   Pulmonary/Chest: Effort normal and breath sounds normal. No respiratory distress.  Abdominal: She exhibits no distension.  Musculoskeletal: Normal range of motion.  Lymphadenopathy:    She  has no cervical adenopathy.       Right: No supraclavicular adenopathy present.       Left: No supraclavicular adenopathy present.  Neurological: She is alert and oriented to person, place, and time.  Skin: Skin is warm and dry.  Psychiatric: She has a normal mood and affect. Her behavior is normal.       Results for orders placed in visit on 09/07/13  URINE CULTURE      Result Value Range   Colony Count NO GROWTH     Organism ID, Bacteria NO GROWTH    COMPREHENSIVE METABOLIC PANEL      Result Value Range   Sodium 137  135 - 145 mEq/L   Potassium 5.1  3.5 - 5.3 mEq/L   Chloride 100  96 - 112 mEq/L   CO2 31  19 - 32 mEq/L   Glucose, Bld 83  70 - 99 mg/dL   BUN 11  6 - 23 mg/dL   Creat 1.61  0.96 - 0.45 mg/dL   Total Bilirubin 0.4  0.3 - 1.2 mg/dL   Alkaline Phosphatase 112  39 - 117 U/L   AST 17  0 - 37 U/L   ALT 13  0 - 35 U/L   Total Protein 7.8  6.0 - 8.3 g/dL   Albumin 4.5  3.5 - 5.2 g/dL   Calcium 40.9  8.4 - 81.1 mg/dL  POCT UA - MICROSCOPIC ONLY      Result Value Range   WBC, Ur, HPF, POC 2-6     RBC, urine, microscopic 5-6     Bacteria, U Microscopic 1+     Mucus, UA pos     Epithelial cells, urine per micros 1-2     Crystals, Ur, HPF, POC neg     Casts, Ur, LPF, POC neg     Yeast, UA neg    POCT URINALYSIS DIPSTICK      Result Value Range   Color, UA yellow     Clarity, UA clear     Glucose, UA neg     Bilirubin, UA neg     Ketones, UA neg     Spec Grav, UA 1.015     Blood, UA trace     pH, UA 7.0     Protein, UA neg     Urobilinogen, UA 0.2     Nitrite, UA neg     Leukocytes, UA Trace    POCT CBC      Result Value Range   WBC 8.1  4.6 - 10.2 K/uL   Lymph, poc 2.4  0.6 - 3.4   POC LYMPH PERCENT 30.0  10 - 50 %L   MID (cbc) 0.5  0 - 0.9   POC MID % 6.1  0 - 12 %M   POC Granulocyte 5.2  2 - 6.9   Granulocyte percent 63.9  37 - 80 %G   RBC 4.87  4.04 - 5.48 M/uL   Hemoglobin 15.0  12.2 - 16.2 g/dL   HCT, POC 16.1  09.6 - 47.9 %   MCV 96.0  80 -  97 fL   MCH, POC 30.8  27 - 31.2 pg   MCHC 32.1  31.8 - 35.4 g/dL   RDW, POC 04.5     Platelet Count, POC 401  142 - 424 K/uL   MPV 10.1  0 - 99.8 fL    Assessment & Plan:  Fever, unspecified - Plan: POCT UA - Microscopic Only, POCT urinalysis dipstick, POCT CBC, Comprehensive metabolic panel, methylPREDNISolone acetate (DEPO-MEDROL) injection 120 mg, Urine culture - repeat labs but so far all have been reassuring that her current URI illness is just coincidental to her bug bite.  Repeat CMP due to low na prior.  Otitis media, left - Plan: methylPREDNISolone acetate (DEPO-MEDROL) injection 120 mg, Urine culture - new - start omnicef. Has 2 more days of doxy - pt will cont to finish course.  Yeast infection of the vagina - Plan: fluconazole (DIFLUCAN) 150 MG tablet, methylPREDNISolone acetate (DEPO-MEDROL) injection 120 mg, Urine culture  Meds ordered this encounter  Medications  . cefdinir (OMNICEF) 300 MG capsule    Sig: Take 1 capsule (300 mg total) by mouth 2 (two) times daily.    Dispense:  20 capsule    Refill:  0  . fluconazole (DIFLUCAN) 150 MG tablet    Sig: Take 1 tablet (150 mg total) by mouth once.    Dispense:  1 tablet    Refill:  1    Order Specific Question:  Supervising Provider    Answer:  FONTAINE, TIMOTHY P [[id number]]  . methylPREDNISolone acetate (DEPO-MEDROL) injection 120 mg    Sig:     I personally performed the services described in this documentation, which was scribed in my presence. The recorded information has been reviewed and considered, and addended by me as needed.  Norberto Sorenson, MD MPH

## 2013-09-22 ENCOUNTER — Other Ambulatory Visit: Payer: Self-pay | Admitting: Women's Health

## 2013-09-22 ENCOUNTER — Encounter: Payer: Self-pay | Admitting: Women's Health

## 2013-09-22 ENCOUNTER — Ambulatory Visit (INDEPENDENT_AMBULATORY_CARE_PROVIDER_SITE_OTHER): Payer: BC Managed Care – PPO

## 2013-09-22 ENCOUNTER — Encounter (HOSPITAL_COMMUNITY): Payer: Self-pay | Admitting: Emergency Medicine

## 2013-09-22 ENCOUNTER — Emergency Department (HOSPITAL_COMMUNITY)
Admission: EM | Admit: 2013-09-22 | Discharge: 2013-09-22 | Disposition: A | Discharge status: Home / Self Care | Payer: BC Managed Care – PPO | Attending: Emergency Medicine | Admitting: Emergency Medicine

## 2013-09-22 ENCOUNTER — Ambulatory Visit (INDEPENDENT_AMBULATORY_CARE_PROVIDER_SITE_OTHER): Payer: BC Managed Care – PPO | Admitting: Women's Health

## 2013-09-22 ENCOUNTER — Emergency Department (HOSPITAL_COMMUNITY): Payer: BC Managed Care – PPO

## 2013-09-22 VITALS — Temp 98.6°F

## 2013-09-22 DIAGNOSIS — N898 Other specified noninflammatory disorders of vagina: Secondary | ICD-10-CM | POA: Insufficient documentation

## 2013-09-22 DIAGNOSIS — F411 Generalized anxiety disorder: Secondary | ICD-10-CM | POA: Insufficient documentation

## 2013-09-22 DIAGNOSIS — Z872 Personal history of diseases of the skin and subcutaneous tissue: Secondary | ICD-10-CM | POA: Insufficient documentation

## 2013-09-22 DIAGNOSIS — R112 Nausea with vomiting, unspecified: Secondary | ICD-10-CM

## 2013-09-22 DIAGNOSIS — N201 Calculus of ureter: Secondary | ICD-10-CM | POA: Insufficient documentation

## 2013-09-22 DIAGNOSIS — R1032 Left lower quadrant pain: Secondary | ICD-10-CM

## 2013-09-22 DIAGNOSIS — Z3202 Encounter for pregnancy test, result negative: Secondary | ICD-10-CM | POA: Insufficient documentation

## 2013-09-22 DIAGNOSIS — R102 Pelvic and perineal pain: Secondary | ICD-10-CM

## 2013-09-22 DIAGNOSIS — Z79899 Other long term (current) drug therapy: Secondary | ICD-10-CM | POA: Insufficient documentation

## 2013-09-22 DIAGNOSIS — F3289 Other specified depressive episodes: Secondary | ICD-10-CM | POA: Insufficient documentation

## 2013-09-22 DIAGNOSIS — F329 Major depressive disorder, single episode, unspecified: Secondary | ICD-10-CM | POA: Insufficient documentation

## 2013-09-22 DIAGNOSIS — R110 Nausea: Secondary | ICD-10-CM

## 2013-09-22 DIAGNOSIS — N949 Unspecified condition associated with female genital organs and menstrual cycle: Secondary | ICD-10-CM

## 2013-09-22 LAB — URINALYSIS W MICROSCOPIC + REFLEX CULTURE
Bilirubin Urine: NEGATIVE
Casts: NONE SEEN
Crystals: NONE SEEN
Ketones, ur: 40 mg/dL — AB
Leukocytes, UA: NEGATIVE
Specific Gravity, Urine: >1.03 — ABNORMAL HIGH (ref 1.005–1.030)
Urobilinogen, UA: 0.2 mg/dL (ref 0.0–1.0)

## 2013-09-22 LAB — URINALYSIS, ROUTINE W REFLEX MICROSCOPIC
Bilirubin Urine: NEGATIVE
Ketones, ur: NEGATIVE mg/dL
Nitrite: NEGATIVE
Urobilinogen, UA: 0.2 mg/dL (ref 0.0–1.0)

## 2013-09-22 LAB — POCT I-STAT, CHEM 8
BUN: 20 mg/dL (ref 6–23)
Calcium, Ion: 1.21 mmol/L (ref 1.12–1.23)
Glucose, Bld: 96 mg/dL (ref 70–99)
HCT: 44 % (ref 36.0–46.0)
Hemoglobin: 15 g/dL (ref 12.0–15.0)
Sodium: 140 mEq/L (ref 135–145)
TCO2: 26 mmol/L (ref 0–100)

## 2013-09-22 LAB — URINE MICROSCOPIC-ADD ON

## 2013-09-22 MED ORDER — ONDANSETRON HCL 4 MG/2ML IJ SOLN
4.0000 mg | Freq: Once | INTRAMUSCULAR | Status: AC
  Administered 2013-09-22: 4 mg via INTRAVENOUS
  Filled 2013-09-22: qty 2

## 2013-09-22 MED ORDER — OXYCODONE-ACETAMINOPHEN 5-325 MG PO TABS
2.0000 | ORAL_TABLET | Freq: Once | ORAL | Status: AC
  Administered 2013-09-22: 2 via ORAL
  Filled 2013-09-22: qty 2

## 2013-09-22 MED ORDER — SODIUM CHLORIDE 0.9 % IV BOLUS (SEPSIS)
1000.0000 mL | Freq: Once | INTRAVENOUS | Status: AC
  Administered 2013-09-22: 1000 mL via INTRAVENOUS

## 2013-09-22 MED ORDER — HYDROMORPHONE HCL PF 1 MG/ML IJ SOLN
0.5000 mg | Freq: Once | INTRAMUSCULAR | Status: AC
  Administered 2013-09-22: 0.5 mg via INTRAVENOUS
  Filled 2013-09-22: qty 1

## 2013-09-22 MED ORDER — ONDANSETRON HCL 4 MG PO TABS: 4.0000 mg | ORAL_TABLET | Freq: Three times a day (TID) | ORAL | Status: DC | PRN

## 2013-09-22 MED ORDER — ONDANSETRON 4 MG PO TBDP
4.0000 mg | ORAL_TABLET | Freq: Three times a day (TID) | ORAL | Status: DC | PRN
Start: 2013-09-22 — End: 2014-04-20

## 2013-09-22 MED ORDER — KETOROLAC TROMETHAMINE 15 MG/ML IJ SOLN
15.0000 mg | Freq: Once | INTRAMUSCULAR | Status: AC
  Administered 2013-09-22: 15 mg via INTRAVENOUS
  Filled 2013-09-22: qty 1

## 2013-09-22 MED ORDER — KETOROLAC TROMETHAMINE 30 MG/ML IJ SOLN
60.0000 mg | Freq: Once | INTRAMUSCULAR | Status: AC
  Administered 2013-09-22: 60 mg via INTRAVENOUS

## 2013-09-22 MED ORDER — NAPROXEN 500 MG PO TABS: 500.0000 mg | ORAL_TABLET | Freq: Two times a day (BID) | ORAL | Status: DC

## 2013-09-22 MED ORDER — OXYCODONE-ACETAMINOPHEN 5-325 MG PO TABS: 1.0000 | ORAL_TABLET | Freq: Four times a day (QID) | ORAL | Status: DC | PRN

## 2013-09-22 NOTE — Progress Notes (Signed)
Patient ID: Patricia Hogan, female   DOB: 1986-01-13, 27 y.o.   MRN: [id number] Presents with new-onset excruciating lower left quadrant pain that started earlier today rates 9. Pain onset was sudden, had to lie down. Vomited 3 times today. Menstrual cycle started today but states it is not menstrual-like cramps. Not sexually active, on Yaz.  Of significance this past month October 8 spider bite was treated with doxycycline, October 13 flulike symptoms with congestion diagnosed with mono treated with Omnicef, prednisone and Diflucan. Mother accompanied patient, patient's mother states has never seen daughter in such pain.  Exam: Very uncomfortable, unable to sit or lie still. Vomited. Pain mostly localized in the lower left quadrant, no rebound or radiation of pain. No CVAT. UA: Moderate blood, 11-20  RBCs, rare bacteria. Ultrasound: No abnormalities noted. Normal uterus and ovaries. No torsion.  No free fluid noted. Moderate amount of stool noted.  Left lower quadrant pain Questionable kidney stone  Plan: Options reviewed, emergency room, requested something for pain. Toradol 60 mg IM given. Had some relief of pain, still very uncomfortable. Report called to [facility] long emergency room to evaluate pain R/O kidney stone. Urine culture pending. Copy of Ultrasound and urine given to take to hospital.

## 2013-09-22 NOTE — Patient Instructions (Signed)

## 2013-09-22 NOTE — ED Notes (Signed)
Pt sent from OB/GYN for possible kidney stone. Pt state she has L flank pain. Pt states pain started at 1130 am today. Pt states she also has thrown up 4x today. Also having urinary burning.

## 2013-09-22 NOTE — Addendum Note (Signed)
Addended by: Richardson Chiquito on: 09/22/2013 05:52 PM   Modules accepted: Orders

## 2013-09-22 NOTE — ED Notes (Signed)
Pt being sent by PA Harriett Sine from [facility]. Pt having LLQ abdominal pain. [place] was normal. Urine with moderate blood, no bacertia. Given 60 mg torodal. Sent over for suspected kidney stone.

## 2013-09-22 NOTE — ED Provider Notes (Signed)
CSN: [id number]     Arrival date & time 09/22/13  1700 History   First MD Initiated Contact with Patient 09/22/13 1715     Chief Complaint  Patient presents with  . Flank Pain   (Consider location/radiation/quality/duration/timing/severity/associated sxs/prior Treatment) HPI Comments: Patient presents with acute onset of left flank and lower abdominal pain that began acutely around 11:30 AM. The pain was associated with vomiting and was severe. It is associated with dysuria and urgency. Patient went to her gynecologist. She had a negative transvaginal ultrasound and hematuria noted. She was sent to the emergency department for evaluation of left ureteral stone. Patient admits to having some mild back pain last evening. At the GYN, patient was administered 60 mg of Toradol. This improved her pain and currently she only has some mild dull pain in her lower back. No fevers. Currently on period. The onset of this condition was acute. The course is improved. Aggravating factors: none. Alleviating factors: none.    Patient is a 27 y.o. female presenting with flank pain. The history is provided by the patient.  Flank Pain Associated symptoms include abdominal pain, nausea and vomiting. Pertinent negatives include no chest pain, coughing, fever, headaches, myalgias, rash or sore throat.    Past Medical History  Diagnosis Date  . ASCUS (atypical squamous cells of undetermined significance) on Pap smear 02/2011    POSITIVE HIGH RISK HPV --C&B  . Depression   . Anxiety   . Acne    Past Surgical History  Procedure Laterality Date  . Augmentation mammaplasty  2004  . Breast surgery  9-12    R IMPLANT RUPTURED HAD BOTH IMPLANTS  REPLACED  . Colposcopy    . Wisdom tooth extraction     Family History  Problem Relation Age of Onset  . Diabetes Brother     JODM  . Hypertension Maternal Grandmother   . Diabetes Maternal Grandfather   . Breast cancer Paternal Grandmother 63   History  Substance  Use Topics  . Smoking status: Never Smoker   . Smokeless tobacco: Never Used  . Alcohol Use: No   OB History   Grav Para Term Preterm Abortions TAB SAB Ect Mult Living   0              Review of Systems  Constitutional: Negative for fever.  HENT: Negative for rhinorrhea and sore throat.   Eyes: Negative for redness.  Respiratory: Negative for cough.   Cardiovascular: Negative for chest pain.  Gastrointestinal: Positive for nausea, vomiting and abdominal pain. Negative for diarrhea.  Genitourinary: Positive for flank pain and vaginal bleeding. Negative for dysuria and vaginal discharge.  Musculoskeletal: Negative for myalgias.  Skin: Negative for rash.  Neurological: Negative for headaches.    Allergies  Other and Sulfa antibiotics  Home Medications   Current Outpatient Rx  Name  Route  Sig  Dispense  Refill  . BIOTIN PO   Oral   Take by mouth.         Marland Kitchen buPROPion (WELLBUTRIN XL) 300 MG 24 hr tablet   Oral   Take 300 mg by mouth daily.         . drospirenone-ethinyl estradiol (YAZ,GIANVI,LORYNA) 3-0.02 MG tablet   Oral   Take 1 tablet by mouth daily.   1 Package   12   . ferrous sulfate 325 (65 FE) MG tablet   Oral   Take 325 mg by mouth daily with breakfast.           .  ibuprofen (ADVIL,MOTRIN) 200 MG tablet   Oral   Take 800 mg by mouth every 6 (six) hours as needed for pain.         . Loratadine (CLARITIN PO)   Oral   Take by mouth.          BP 117/96  Pulse 69  Temp(Src) 98.6 F (37 C) (Oral)  Resp 16  SpO2 100%  LMP 09/22/2013 Physical Exam  Nursing note and vitals reviewed. Constitutional: She appears well-developed and well-nourished.  HENT:  Head: Normocephalic and atraumatic.  Eyes: Conjunctivae are normal. Right eye exhibits no discharge. Left eye exhibits no discharge.  Neck: Normal range of motion. Neck supple.  Cardiovascular: Normal rate, regular rhythm and normal heart sounds.   Pulmonary/Chest: Effort normal and breath  sounds normal.  Abdominal: Soft. There is no tenderness. There is CVA tenderness (mild L).  Neurological: She is alert.  Skin: Skin is warm and dry.  Psychiatric: She has a normal mood and affect.    ED Course  Procedures (including critical care time) Labs Review Labs Reviewed  URINALYSIS, ROUTINE W REFLEX MICROSCOPIC - Abnormal; Notable for the following:    Specific Gravity, Urine 1.041 (*)    Hgb urine dipstick SMALL (*)    All other components within normal limits  POCT I-STAT, CHEM 8 - Abnormal; Notable for the following:    Creatinine, Ser 1.50 (*)    All other components within normal limits  URINE MICROSCOPIC-ADD ON   Imaging Review Ct Abdomen Pelvis Wo Contrast  09/22/2013   CLINICAL DATA:  Left flank pain, no history of kidney stones  EXAM: CT ABDOMEN AND PELVIS WITHOUT CONTRAST  TECHNIQUE: Multidetector CT imaging of the abdomen and pelvis was performed following the standard protocol without intravenous contrast. Sagittal and coronal MPR images reconstructed from axial data set. Oral contrast not administered for this indication.  COMPARISON:  None  FINDINGS: Lung bases clear.  Tiny nonobstructing calculus left renal calculi.  Minimal left hydronephrosis and hydroureter secondary to a 1-2 mm diameter distal left ureteral calculus at the left ureterovesical junction.  No right-sided urinary tract calcification or dilatation.  Within limits of a nonenhanced exam no additional focal abnormalities of the liver, spleen, pancreas, kidneys, or adrenal glands.  Unremarkable uterus, adnexae, stomach, and bowel loops.  Tampon in vagina.  No mass, adenopathy, free fluid or inflammatory process.  Appendix not definitely visualized; no pericecal inflammatory process.  No acute osseous findings.  IMPRESSION: 1-2 mm distal left ureteral calculus at the left ureterovesical junction causing minimal left hydronephrosis and hydroureter.  Additional tiny nonobstructing left renal calculi.    Electronically Signed   By: Ulyses Southward M.D.   On: 09/22/2013 18:21    EKG Interpretation   None      5:46 PM Patient seen and examined. Work-up initiated. Pain currently controlled.  Vital signs reviewed and are as follows: Filed Vitals:   09/22/13 1708  BP: 117/96  Pulse: 69  Temp: 98.6 F (37 C)  Resp: 16   8:17 PM Pt informed of results. Pain increased to 8/10. Additional pain meds/fluids ordered.   9:27 PM Patient feeling better. Tolerating PO fluids. Pain improved.   9:27 PM Patient counseled on kidney stone treatment. Urged patient to strain urine and save any stones. Urged urology follow-up and return to [facility] with any complications. Counseled patient to maintain good fluid intake.   Patient counseled on use of narcotic pain medications. Counseled not to combine these medications with others  containing tylenol. Urged not to drink alcohol, drive, or perform any other activities that requires focus while taking these medications. The patient verbalizes understanding and agrees with the plan.    MDM   1. Ureteral stone    Patient with small ureteral stone at UVJ. Slight elevation in creatinine -- fluids given. Pain controlled. Patient appears well, tolerating PO's. Standard tx for ureteral stone given.     Renne Crigler, PA-C 09/22/13 2128

## 2013-09-24 LAB — URINE CULTURE: Colony Count: 45000

## 2013-09-25 NOTE — ED Provider Notes (Signed)
Medical screening examination/treatment/procedure(s) were performed by non-physician practitioner and as supervising physician I was immediately available for consultation/collaboration.  EKG Interpretation   None         Layson Bertsch M Khadeeja Elden, DO 09/25/13 0137 

## 2013-09-28 ENCOUNTER — Other Ambulatory Visit: Payer: Self-pay

## 2014-04-19 ENCOUNTER — Other Ambulatory Visit: Payer: Self-pay

## 2014-04-19 DIAGNOSIS — IMO0001 Reserved for inherently not codable concepts without codable children: Secondary | ICD-10-CM

## 2014-04-19 MED ORDER — DROSPIRENONE-ETHINYL ESTRADIOL 3-0.02 MG PO TABS: 1.0000 | ORAL_TABLET | Freq: Every day | ORAL | Status: DC

## 2014-04-20 ENCOUNTER — Encounter: Payer: Self-pay | Admitting: Women's Health

## 2014-04-20 ENCOUNTER — Other Ambulatory Visit (HOSPITAL_COMMUNITY)
Admission: RE | Admit: 2014-04-20 | Discharge: 2014-04-20 | Disposition: A | Discharge status: Home / Self Care | Payer: BC Managed Care – PPO | Source: Ambulatory Visit | Attending: Women's Health | Admitting: Women's Health

## 2014-04-20 ENCOUNTER — Ambulatory Visit (INDEPENDENT_AMBULATORY_CARE_PROVIDER_SITE_OTHER): Payer: BC Managed Care – PPO | Admitting: Women's Health

## 2014-04-20 VITALS — BP 124/78 | Ht 63.75 in | Wt 183.0 lb

## 2014-04-20 DIAGNOSIS — IMO0001 Reserved for inherently not codable concepts without codable children: Secondary | ICD-10-CM

## 2014-04-20 DIAGNOSIS — Z1151 Encounter for screening for human papillomavirus (HPV): Secondary | ICD-10-CM | POA: Insufficient documentation

## 2014-04-20 DIAGNOSIS — Z01419 Encounter for gynecological examination (general) (routine) without abnormal findings: Secondary | ICD-10-CM | POA: Insufficient documentation

## 2014-04-20 DIAGNOSIS — B373 Candidiasis of vulva and vagina: Secondary | ICD-10-CM

## 2014-04-20 DIAGNOSIS — B3731 Acute candidiasis of vulva and vagina: Secondary | ICD-10-CM

## 2014-04-20 DIAGNOSIS — Z113 Encounter for screening for infections with a predominantly sexual mode of transmission: Secondary | ICD-10-CM

## 2014-04-20 DIAGNOSIS — Z309 Encounter for contraceptive management, unspecified: Secondary | ICD-10-CM

## 2014-04-20 DIAGNOSIS — Z833 Family history of diabetes mellitus: Secondary | ICD-10-CM

## 2014-04-20 LAB — CBC WITH DIFFERENTIAL/PLATELET
BASOS PCT: 0 % (ref 0–1)
Basophils Absolute: 0 10*3/uL (ref 0.0–0.1)
Eosinophils Absolute: 0.2 10*3/uL (ref 0.0–0.7)
Eosinophils Relative: 2 % (ref 0–5)
HCT: 41.7 % (ref 36.0–46.0)
HEMOGLOBIN: 14.3 g/dL (ref 12.0–15.0)
LYMPHS ABS: 1.7 10*3/uL (ref 0.7–4.0)
Lymphocytes Relative: 19 % (ref 12–46)
MCH: 30.2 pg (ref 26.0–34.0)
MCHC: 34.3 g/dL (ref 30.0–36.0)
MCV: 88.2 fL (ref 78.0–100.0)
MONOS PCT: 8 % (ref 3–12)
Monocytes Absolute: 0.7 10*3/uL (ref 0.1–1.0)
NEUTROS ABS: 6.2 10*3/uL (ref 1.7–7.7)
NEUTROS PCT: 71 % (ref 43–77)
Platelets: 401 10*3/uL — ABNORMAL HIGH (ref 150–400)
RBC: 4.73 MIL/uL (ref 3.87–5.11)
RDW: 12.5 % (ref 11.5–15.5)
WBC: 8.7 10*3/uL (ref 4.0–10.5)

## 2014-04-20 LAB — HEPATITIS B SURFACE ANTIGEN: HEP B S AG: NEGATIVE

## 2014-04-20 LAB — RPR

## 2014-04-20 LAB — GLUCOSE, RANDOM: GLUCOSE: 95 mg/dL (ref 70–99)

## 2014-04-20 LAB — HIV ANTIBODY (ROUTINE TESTING W REFLEX): HIV 1&2 Ab, 4th Generation: NONREACTIVE

## 2014-04-20 LAB — HEPATITIS C ANTIBODY: HCV AB: NEGATIVE

## 2014-04-20 MED ORDER — FLUCONAZOLE 150 MG PO TABS: 150.0000 mg | ORAL_TABLET | Freq: Once | ORAL | Status: DC

## 2014-04-20 MED ORDER — TERCONAZOLE 0.4 % VA CREA: 1.0000 | TOPICAL_CREAM | Freq: Every day | VAGINAL | Status: DC

## 2014-04-20 MED ORDER — DROSPIRENONE-ETHINYL ESTRADIOL 3-0.02 MG PO TABS: 1.0000 | ORAL_TABLET | Freq: Every day | ORAL | Status: DC

## 2014-04-20 NOTE — Addendum Note (Signed)
Addended by: Berna Spare A on: 04/20/2014 09:25 AM   Modules accepted: Orders

## 2014-04-20 NOTE — Progress Notes (Signed)
Patricia Hogan 08/01/1986 [id number]    History:    Presents for annual exam.  Monthly cycle on Yaz. New partner. Gardasil series completed. 2012 ascus with positive HR HPV with LGSIL on biopsy. 2004 breast implants, right breast implant rupture 2012, bilat replacement. Anxiety and depression managed by psychiatrist.  Past medical history, past surgical history, family history and social history were all reviewed and documented in the EPIC chart. 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   ROS:  A  12 point ROS was performed and pertinent positives and negatives are included.  Exam:  Filed Vitals:   04/20/14 0825  BP: 124/78    General appearance:  Normal Thyroid:  Symmetrical, normal in size, without palpable masses or nodularity. Respiratory  Auscultation:  Clear without wheezing or rhonchi Cardiovascular  Auscultation:  Regular rate, without rubs, murmurs or gallops  Edema/varicosities:  Not grossly evident Abdominal  Soft,nontender, without masses, guarding or rebound.  Liver/spleen:  No organomegaly noted  Hernia:  None appreciated  Skin  Inspection:  Grossly normal   Breasts: Examined lying and sitting/bilateral saline implants.     Right: Without masses, retractions, discharge or axillary adenopathy.     Left: Without masses, retractions, discharge or axillary adenopathy. Gentitourinary   Inguinal/mons:  Normal without inguinal adenopathy  External genitalia:  Normal  BUS/Urethra/Skene's glands:  Normal  Vagina:  Normal  Cervix:  Normal  Uterus:   normal in size, shape and contour.  Midline and mobile  Adnexa/parametria:     Rt: Without masses or tenderness.   Lt: Without masses or tenderness.  Anus and perineum: Normal  Digital rectal exam: Normal sphincter tone without palpated masses or tenderness  Assessment/Plan:  28 y.o.SWF G0  for annual exam no complaints.  2012 -Pap positive HR HPV with LGSIL on biopsy STD screen Anxiety/depression managed by  psychiatrist stable on Wellbutrin  Kidney stone in 2015  Plan: Yaz prescription, proper use, slight risk for blood clots and strokes reviewed. Condoms encouraged until permanent partner. SBE's, regular exercise, decrease calories for weight loss, MVI daily encouraged. CBC, UA, Pap with HR HPV typing, GC/Chlamydia, HIV, hep B, C., RPR.   Note: This dictation was prepared with Dragon/digital dictation.  Any transcriptional errors that result are unintentional. Harrington Challenger Dr[facility], 9:09 AM 04/20/2014

## 2014-04-20 NOTE — Patient Instructions (Signed)

## 2014-04-21 LAB — URINALYSIS W MICROSCOPIC + REFLEX CULTURE
Bilirubin Urine: NEGATIVE
Casts: NONE SEEN
Crystals: NONE SEEN
Glucose, UA: NEGATIVE mg/dL
Ketones, ur: NEGATIVE mg/dL
LEUKOCYTES UA: NEGATIVE
NITRITE: NEGATIVE
Protein, ur: NEGATIVE mg/dL
SPECIFIC GRAVITY, URINE: 1.019 (ref 1.005–1.030)
Urobilinogen, UA: 0.2 mg/dL (ref 0.0–1.0)
pH: 6 (ref 5.0–8.0)

## 2014-04-21 LAB — GC/CHLAMYDIA PROBE AMP
CT PROBE, AMP APTIMA: NEGATIVE
GC Probe RNA: NEGATIVE

## 2014-04-23 LAB — URINE CULTURE: Colony Count: 95000

## 2014-04-25 ENCOUNTER — Other Ambulatory Visit: Payer: Self-pay | Admitting: *Deleted

## 2014-04-25 MED ORDER — NITROFURANTOIN MONOHYD MACRO 100 MG PO CAPS: ORAL_CAPSULE | ORAL | Status: DC

## 2014-09-06 ENCOUNTER — Encounter: Payer: Self-pay | Admitting: Women's Health

## 2014-09-06 ENCOUNTER — Ambulatory Visit (INDEPENDENT_AMBULATORY_CARE_PROVIDER_SITE_OTHER): Payer: BC Managed Care – PPO | Admitting: Women's Health

## 2014-09-06 DIAGNOSIS — B3731 Acute candidiasis of vulva and vagina: Secondary | ICD-10-CM

## 2014-09-06 DIAGNOSIS — N760 Acute vaginitis: Secondary | ICD-10-CM

## 2014-09-06 DIAGNOSIS — Z113 Encounter for screening for infections with a predominantly sexual mode of transmission: Secondary | ICD-10-CM

## 2014-09-06 DIAGNOSIS — B373 Candidiasis of vulva and vagina: Secondary | ICD-10-CM

## 2014-09-06 DIAGNOSIS — B9689 Other specified bacterial agents as the cause of diseases classified elsewhere: Secondary | ICD-10-CM

## 2014-09-06 DIAGNOSIS — A499 Bacterial infection, unspecified: Secondary | ICD-10-CM

## 2014-09-06 LAB — WET PREP FOR TRICH, YEAST, CLUE: TRICH WET PREP: NONE SEEN

## 2014-09-06 MED ORDER — METRONIDAZOLE 0.75 % VA GEL: VAGINAL | Status: DC

## 2014-09-06 MED ORDER — NYSTATIN 100000 UNIT/GM EX CREA: TOPICAL_CREAM | Freq: Two times a day (BID) | CUTANEOUS | Status: DC

## 2014-09-06 MED ORDER — FLUCONAZOLE 150 MG PO TABS: 150.0000 mg | ORAL_TABLET | Freq: Once | ORAL | Status: DC

## 2014-09-06 NOTE — Patient Instructions (Signed)
Bacterial Vaginosis Bacterial vaginosis is an infection of the vagina. It happens when too many of certain germs (bacteria) grow in the vagina. HOME CARE  Take your medicine as told by your doctor.  Finish your medicine even if you start to feel better.  Do not have sex until you finish your medicine and are better.  Tell your sex partner that you have an infection. They should see their doctor for treatment.  Practice safe sex. Use condoms. Have only one sex partner. GET HELP IF:  You are not getting better after 3 days of treatment.  You have more grey fluid (discharge) coming from your vagina than before.  You have more pain than before.  You have a fever. MAKE SURE YOU:   Understand these instructions.  Will watch your condition.  Will get help right away if you are not doing well or get worse. Document Released: 08/18/2008 Document Revised: 08/30/2013 Document Reviewed: 06/21/2013 ExitCare Patient Information 2015 [employer]. This information is not intended to replace advice given to you by your health care provider. Make sure you discuss any questions you have with your health care provider.  

## 2014-09-06 NOTE — Progress Notes (Signed)
Patient ID: Patricia Hogan, female   DOB: 07/25/1986, 28 y.o.   MRN: [id number] Presents requesting STD screen and complaint of vaginal discharge. Partner for 8 months found out was married. Contraceptives on Yaz without a problem. Denies urinary symptoms, abdominal pain or fever.  Exam: Appears well. External genitalia within normal limits, speculum exam scant discharge, wet prep positive for yeast, clues, and TNTC bacteria. GC/Chlamydia culture taken. Bimanual no CMT or adnexal fullness or tenderness.  Yeast vaginitis Bacteria vaginosis STD screening  Plan: Counseling encouraged, Diflucan 150 by mouth x1 dose, MetroGel vaginal cream 1 applicator at bedtime x5, alcohol precautions reviewed. GC/Chlamydia culture pending, HIV, hep B, C., RPR.

## 2014-09-07 ENCOUNTER — Encounter: Payer: Self-pay | Admitting: Women's Health

## 2014-09-07 LAB — GC/CHLAMYDIA PROBE AMP
CT Probe RNA: NEGATIVE
GC Probe RNA: NEGATIVE

## 2014-09-07 LAB — RPR

## 2014-09-07 LAB — HEPATITIS C ANTIBODY: HCV Ab: NEGATIVE

## 2014-09-07 LAB — HEPATITIS B SURFACE ANTIGEN: Hepatitis B Surface Ag: NEGATIVE

## 2014-09-07 LAB — HIV ANTIBODY (ROUTINE TESTING W REFLEX): HIV: NONREACTIVE

## 2015-03-12 ENCOUNTER — Other Ambulatory Visit: Payer: Self-pay | Admitting: Orthopedic Surgery

## 2015-05-03 ENCOUNTER — Encounter: Payer: Self-pay | Admitting: Women's Health

## 2015-05-03 ENCOUNTER — Ambulatory Visit (INDEPENDENT_AMBULATORY_CARE_PROVIDER_SITE_OTHER): Payer: 59 | Admitting: Women's Health

## 2015-05-03 VITALS — BP 120/74 | Ht 64.0 in | Wt 183.0 lb

## 2015-05-03 DIAGNOSIS — Z3041 Encounter for surveillance of contraceptive pills: Secondary | ICD-10-CM

## 2015-05-03 DIAGNOSIS — Z01419 Encounter for gynecological examination (general) (routine) without abnormal findings: Secondary | ICD-10-CM | POA: Diagnosis not present

## 2015-05-03 MED ORDER — NYSTATIN 100000 UNIT/GM EX CREA: TOPICAL_CREAM | Freq: Two times a day (BID) | CUTANEOUS | Status: DC

## 2015-05-03 MED ORDER — DROSPIRENONE-ETHINYL ESTRADIOL 3-0.02 MG PO TABS: 1.0000 | ORAL_TABLET | Freq: Every day | ORAL | Status: DC

## 2015-05-03 NOTE — Progress Notes (Signed)
Patricia Hogan 02/08/1986 [id number]    History:    Presents for annual exam.  Light monthly cycle on Yaz. 2012 LGSIL/CIN-1 on colposcopy and biopsy normal Pap with negative HR HPV 2015.  Negative STD screen not sexually active. Scheduled for surgery 06/08/2015 for right wrist ganglion cyst. Gardasil series completed. Started exercise program this past year running half marathons.  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  ROS:  A ROS was performed and pertinent positives and negatives are included.  Exam:  Filed Vitals:   05/03/15 0843  BP: 120/74    General appearance:  Normal Thyroid:  Symmetrical, normal in size, without palpable masses or nodularity. Respiratory  Auscultation:  Clear without wheezing or rhonchi Cardiovascular  Auscultation:  Regular rate, without rubs, murmurs or gallops  Edema/varicosities:  Not grossly evident Abdominal  Soft,nontender, without masses, guarding or rebound.  Liver/spleen:  No organomegaly noted  Hernia:  None appreciated  Skin  Inspection:  Grossly normal   Breasts: Examined lying and sitting/saline implants.     Right: Without masses, retractions, discharge or axillary adenopathy.     Left: Without masses, retractions, discharge or axillary adenopathy. Gentitourinary   Inguinal/mons:  Normal without inguinal adenopathy  External genitalia:  Normal  BUS/Urethra/Skene's glands:  Normal  Vagina:  Normal  Cervix:  Normal  Uterus:   normal in size, shape and contour.  Midline and mobile  Adnexa/parametria:     Rt: Without masses or tenderness.   Lt: Without masses or tenderness.  Anus and perineum: Normal  Digital rectal exam: Normal sphincter tone without palpated masses or tenderness  Assessment/Plan:  29 y.o. SWF G0 for annual exam complaint of hair loss, questionable spot on right labia, fever  blister/HSV on nose, bright red bleeding with constipation.  Light cycle on Yaz/not sexually active LGSIL 2012 normal Paps after Fever blister on nose Anxiety/depression labs and meds at primary care  Plan: Yaz prescription, proper use, slight risk for blood clots and strokes reviewed. Condoms encouraged if sexually active. TSH, UA. Pap screening guidelines reviewed. Valtrex 500 twice daily for 3-5 days as needed. Constipation prevention reviewed, if continued problems follow-up with GI. SBE's, continue regular exercise/running, MVI daily encouraged.    Harrington Challenger [facility], 9:25 AM 05/03/2015

## 2015-05-03 NOTE — Patient Instructions (Signed)

## 2015-05-04 ENCOUNTER — Encounter: Payer: Self-pay | Admitting: Women's Health

## 2015-05-04 LAB — URINALYSIS W MICROSCOPIC + REFLEX CULTURE
BACTERIA UA: NONE SEEN
Bilirubin Urine: NEGATIVE
CASTS: NONE SEEN
Crystals: NONE SEEN
Glucose, UA: NEGATIVE mg/dL
KETONES UR: NEGATIVE mg/dL
Leukocytes, UA: NEGATIVE
NITRITE: NEGATIVE
PH: 6 (ref 5.0–8.0)
Protein, ur: NEGATIVE mg/dL
Specific Gravity, Urine: 1.015 (ref 1.005–1.030)
UROBILINOGEN UA: 0.2 mg/dL (ref 0.0–1.0)

## 2015-05-04 LAB — TSH: TSH: 1.718 u[IU]/mL (ref 0.350–4.500)

## 2015-05-05 LAB — URINE CULTURE: Colony Count: 30000

## 2015-05-24 DIAGNOSIS — R2231 Localized swelling, mass and lump, right upper limb: Secondary | ICD-10-CM

## 2015-05-24 HISTORY — DX: Localized swelling, mass and lump, right upper limb: R22.31

## 2015-06-06 ENCOUNTER — Telehealth: Payer: Self-pay | Admitting: *Deleted

## 2015-06-06 NOTE — Telephone Encounter (Signed)
Pt called c/o last 2 months she has been having hot flashes and bad night sweats. Pt had annual on 05/03/15, TSH level was checked and it was normal. Pt asked if her hormones level  should be checked? Pt aware you are out of the office. Please advise

## 2015-06-06 NOTE — Telephone Encounter (Signed)
[facility] is [facility] plus [facility] age, if she really wants ok to [facility].

## 2015-06-07 ENCOUNTER — Encounter (HOSPITAL_BASED_OUTPATIENT_CLINIC_OR_DEPARTMENT_OTHER): Payer: Self-pay | Admitting: *Deleted

## 2015-06-10 NOTE — Telephone Encounter (Signed)
LEFT MESSAGE FOR PT TO CALL.

## 2015-06-11 NOTE — Telephone Encounter (Signed)
Pt informed with the below note, pt will not have FSH checked.

## 2015-06-14 ENCOUNTER — Encounter (HOSPITAL_BASED_OUTPATIENT_CLINIC_OR_DEPARTMENT_OTHER): Payer: Self-pay | Admitting: Certified Registered"

## 2015-06-14 ENCOUNTER — Ambulatory Visit (HOSPITAL_BASED_OUTPATIENT_CLINIC_OR_DEPARTMENT_OTHER): Payer: 59 | Admitting: Certified Registered"

## 2015-06-14 ENCOUNTER — Ambulatory Visit (HOSPITAL_BASED_OUTPATIENT_CLINIC_OR_DEPARTMENT_OTHER)
Admission: RE | Admit: 2015-06-14 | Discharge: 2015-06-14 | Disposition: A | Discharge status: Home / Self Care | Payer: 59 | Source: Ambulatory Visit | Attending: Orthopedic Surgery | Admitting: Orthopedic Surgery

## 2015-06-14 ENCOUNTER — Encounter (HOSPITAL_BASED_OUTPATIENT_CLINIC_OR_DEPARTMENT_OTHER)
Admission: RE | Disposition: A | Discharge status: Home / Self Care | Payer: Self-pay | Source: Ambulatory Visit | Attending: Orthopedic Surgery

## 2015-06-14 DIAGNOSIS — F419 Anxiety disorder, unspecified: Secondary | ICD-10-CM | POA: Diagnosis not present

## 2015-06-14 DIAGNOSIS — Z888 Allergy status to other drugs, medicaments and biological substances status: Secondary | ICD-10-CM | POA: Insufficient documentation

## 2015-06-14 DIAGNOSIS — M67431 Ganglion, right wrist: Secondary | ICD-10-CM | POA: Insufficient documentation

## 2015-06-14 DIAGNOSIS — F329 Major depressive disorder, single episode, unspecified: Secondary | ICD-10-CM | POA: Diagnosis not present

## 2015-06-14 DIAGNOSIS — Z6833 Body mass index (BMI) 33.0-33.9, adult: Secondary | ICD-10-CM | POA: Diagnosis not present

## 2015-06-14 DIAGNOSIS — Z882 Allergy status to sulfonamides status: Secondary | ICD-10-CM | POA: Insufficient documentation

## 2015-06-14 DIAGNOSIS — Z79899 Other long term (current) drug therapy: Secondary | ICD-10-CM | POA: Diagnosis not present

## 2015-06-14 DIAGNOSIS — R2231 Localized swelling, mass and lump, right upper limb: Secondary | ICD-10-CM | POA: Diagnosis present

## 2015-06-14 HISTORY — DX: Nausea with vomiting, unspecified: R11.2

## 2015-06-14 HISTORY — DX: Other specified postprocedural states: Z98.890

## 2015-06-14 HISTORY — DX: Localized swelling, mass and lump, right upper limb: R22.31

## 2015-06-14 HISTORY — PX: MASS EXCISION: SHX2000

## 2015-06-14 HISTORY — DX: Family history of other specified conditions: Z84.89

## 2015-06-14 SURGERY — EXCISION MASS
Anesthesia: General | Site: Wrist | Laterality: Right

## 2015-06-14 MED ORDER — MIDAZOLAM HCL 2 MG/2ML IJ SOLN
1.0000 mg | INTRAMUSCULAR | Status: DC | PRN
  Administered 2015-06-14: 2 mg via INTRAVENOUS

## 2015-06-14 MED ORDER — GLYCOPYRROLATE 0.2 MG/ML IJ SOLN: 0.2000 mg | Freq: Once | INTRAMUSCULAR | Status: DC | PRN

## 2015-06-14 MED ORDER — BUPIVACAINE HCL (PF) 0.5 % IJ SOLN
INTRAMUSCULAR | Status: AC
  Filled 2015-06-14: qty 150

## 2015-06-14 MED ORDER — HYDROCODONE-ACETAMINOPHEN 5-325 MG PO TABS: 1.0000 | ORAL_TABLET | Freq: Four times a day (QID) | ORAL | Status: DC | PRN

## 2015-06-14 MED ORDER — PROMETHAZINE HCL 25 MG/ML IJ SOLN: 6.2500 mg | INTRAMUSCULAR | Status: DC | PRN

## 2015-06-14 MED ORDER — FENTANYL CITRATE (PF) 100 MCG/2ML IJ SOLN
25.0000 ug | INTRAMUSCULAR | Status: DC | PRN
  Administered 2015-06-14 (×2): 50 ug via INTRAVENOUS

## 2015-06-14 MED ORDER — CEFAZOLIN SODIUM-DEXTROSE 2-3 GM-% IV SOLR
INTRAVENOUS | Status: AC
  Filled 2015-06-14: qty 50

## 2015-06-14 MED ORDER — CHLORHEXIDINE GLUCONATE 4 % EX LIQD: 60.0000 mL | Freq: Once | CUTANEOUS | Status: DC

## 2015-06-14 MED ORDER — SODIUM BICARBONATE 4 % IV SOLN
INTRAVENOUS | Status: AC
  Filled 2015-06-14: qty 10

## 2015-06-14 MED ORDER — SCOPOLAMINE 1 MG/3DAYS TD PT72
MEDICATED_PATCH | TRANSDERMAL | Status: AC
  Filled 2015-06-14: qty 1

## 2015-06-14 MED ORDER — PROPOFOL 10 MG/ML IV BOLUS
INTRAVENOUS | Status: DC | PRN
  Administered 2015-06-14: 200 mg via INTRAVENOUS

## 2015-06-14 MED ORDER — FENTANYL CITRATE (PF) 100 MCG/2ML IJ SOLN
INTRAMUSCULAR | Status: AC
  Filled 2015-06-14: qty 2

## 2015-06-14 MED ORDER — LACTATED RINGERS IV SOLN
INTRAVENOUS | Status: DC
  Administered 2015-06-14 (×2): via INTRAVENOUS

## 2015-06-14 MED ORDER — CEFAZOLIN SODIUM-DEXTROSE 2-3 GM-% IV SOLR
2.0000 g | INTRAVENOUS | Status: AC
  Administered 2015-06-14: 2 g via INTRAVENOUS

## 2015-06-14 MED ORDER — DEXAMETHASONE SODIUM PHOSPHATE 10 MG/ML IJ SOLN
INTRAMUSCULAR | Status: DC | PRN
  Administered 2015-06-14: 10 mg via INTRAVENOUS

## 2015-06-14 MED ORDER — MIDAZOLAM HCL 2 MG/2ML IJ SOLN
INTRAMUSCULAR | Status: AC
  Filled 2015-06-14: qty 2

## 2015-06-14 MED ORDER — MEPERIDINE HCL 25 MG/ML IJ SOLN: 6.2500 mg | INTRAMUSCULAR | Status: DC | PRN

## 2015-06-14 MED ORDER — BUPIVACAINE HCL (PF) 0.25 % IJ SOLN
INTRAMUSCULAR | Status: AC
  Filled 2015-06-14: qty 150

## 2015-06-14 MED ORDER — FENTANYL CITRATE (PF) 100 MCG/2ML IJ SOLN
INTRAMUSCULAR | Status: AC
  Filled 2015-06-14: qty 6

## 2015-06-14 MED ORDER — MIDAZOLAM HCL 2 MG/2ML IJ SOLN: 0.5000 mg | Freq: Once | INTRAMUSCULAR | Status: DC | PRN

## 2015-06-14 MED ORDER — SCOPOLAMINE 1 MG/3DAYS TD PT72
1.0000 | MEDICATED_PATCH | Freq: Once | TRANSDERMAL | Status: AC | PRN
  Administered 2015-06-14: 1 via TRANSDERMAL

## 2015-06-14 MED ORDER — ONDANSETRON HCL 4 MG/2ML IJ SOLN
INTRAMUSCULAR | Status: DC | PRN
  Administered 2015-06-14: 4 mg via INTRAVENOUS

## 2015-06-14 MED ORDER — LIDOCAINE HCL (CARDIAC) 20 MG/ML IV SOLN
INTRAVENOUS | Status: DC | PRN
  Administered 2015-06-14: 30 mg via INTRAVENOUS

## 2015-06-14 MED ORDER — LIDOCAINE HCL (PF) 1 % IJ SOLN
INTRAMUSCULAR | Status: AC
  Filled 2015-06-14: qty 120

## 2015-06-14 MED ORDER — FENTANYL CITRATE (PF) 100 MCG/2ML IJ SOLN
50.0000 ug | INTRAMUSCULAR | Status: AC | PRN
  Administered 2015-06-14: 50 ug via INTRAVENOUS
  Administered 2015-06-14 (×3): 25 ug via INTRAVENOUS

## 2015-06-14 MED ORDER — PROPOFOL 500 MG/50ML IV EMUL
INTRAVENOUS | Status: AC
  Filled 2015-06-14: qty 50

## 2015-06-14 SURGICAL SUPPLY — 54 items
BANDAGE COBAN STERILE 2 (GAUZE/BANDAGES/DRESSINGS) IMPLANT
BANDAGE ELASTIC 3 VELCRO ST LF (GAUZE/BANDAGES/DRESSINGS) ×1 IMPLANT
BLADE SURG 15 STRL LF DISP TIS (BLADE) ×2 IMPLANT
BLADE SURG 15 STRL SS (BLADE) ×4
BNDG COHESIVE 1X5 TAN STRL LF (GAUZE/BANDAGES/DRESSINGS) IMPLANT
BNDG COHESIVE 3X5 TAN STRL LF (GAUZE/BANDAGES/DRESSINGS) IMPLANT
BNDG CONFORM 3 STRL LF (GAUZE/BANDAGES/DRESSINGS) ×2 IMPLANT
BNDG GAUZE ELAST 4 BULKY (GAUZE/BANDAGES/DRESSINGS) IMPLANT
BRUSH SCRUB EZ PLAIN DRY (MISCELLANEOUS) ×2 IMPLANT
CORDS BIPOLAR (ELECTRODE) ×2 IMPLANT
COVER BACK TABLE 60X90IN (DRAPES) ×2 IMPLANT
CUFF TOURNIQUET SINGLE 18IN (TOURNIQUET CUFF) ×1 IMPLANT
DECANTER SPIKE VIAL GLASS SM (MISCELLANEOUS) IMPLANT
DRAPE EXTREMITY T 121X128X90 (DRAPE) ×2 IMPLANT
DRAPE SURG 17X23 STRL (DRAPES) ×2 IMPLANT
DRSG EMULSION OIL 3X3 NADH (GAUZE/BANDAGES/DRESSINGS) ×2 IMPLANT
GAUZE SPONGE 4X4 12PLY STRL (GAUZE/BANDAGES/DRESSINGS) IMPLANT
GLOVE BIOGEL M STRL SZ7.5 (GLOVE) ×1 IMPLANT
GLOVE SS BIOGEL STRL SZ 8 (GLOVE) ×1 IMPLANT
GLOVE SUPERSENSE BIOGEL SZ 8 (GLOVE) ×1
GOWN STRL REUS W/ TWL LRG LVL3 (GOWN DISPOSABLE) ×1 IMPLANT
GOWN STRL REUS W/ TWL XL LVL3 (GOWN DISPOSABLE) ×1 IMPLANT
GOWN STRL REUS W/TWL LRG LVL3 (GOWN DISPOSABLE) ×2
GOWN STRL REUS W/TWL XL LVL3 (GOWN DISPOSABLE) ×2
LOOP VESSEL MAXI BLUE (MISCELLANEOUS) IMPLANT
NDL HYPO 25X1 1.5 SAFETY (NEEDLE) ×1 IMPLANT
NEEDLE HYPO 22GX1.5 SAFETY (NEEDLE) IMPLANT
NEEDLE HYPO 25X1 1.5 SAFETY (NEEDLE) ×2 IMPLANT
NS IRRIG 1000ML POUR BTL (IV SOLUTION) ×2 IMPLANT
PACK BASIN DAY SURGERY FS (CUSTOM PROCEDURE TRAY) ×2 IMPLANT
PAD ALCOHOL SWAB (MISCELLANEOUS) IMPLANT
PAD CAST 3X4 CTTN HI CHSV (CAST SUPPLIES) IMPLANT
PADDING CAST ABS 3INX4YD NS (CAST SUPPLIES)
PADDING CAST ABS 4INX4YD NS (CAST SUPPLIES)
PADDING CAST ABS COTTON 3X4 (CAST SUPPLIES) IMPLANT
PADDING CAST ABS COTTON 4X4 ST (CAST SUPPLIES) IMPLANT
PADDING CAST COTTON 3X4 STRL (CAST SUPPLIES)
SHEET MEDIUM DRAPE 40X70 STRL (DRAPES) ×2 IMPLANT
SPLINT FIBERGLASS 3X35 (CAST SUPPLIES) ×1 IMPLANT
SPLINT PLASTER CAST XFAST 3X15 (CAST SUPPLIES) IMPLANT
SPLINT PLASTER CAST XFAST 4X15 (CAST SUPPLIES) IMPLANT
SPLINT PLASTER XTRA FAST SET 4 (CAST SUPPLIES)
SPLINT PLASTER XTRA FASTSET 3X (CAST SUPPLIES)
STOCKINETTE 4X48 STRL (DRAPES) ×2 IMPLANT
STOCKINETTE SYNTHETIC 3 UNSTER (CAST SUPPLIES) IMPLANT
STOCKINETTE SYNTHETIC 4 NONSTR (MISCELLANEOUS) ×1 IMPLANT
STRIP CLOSURE SKIN 1/2X4 (GAUZE/BANDAGES/DRESSINGS) IMPLANT
SUT PROLENE 4 0 PS 2 18 (SUTURE) ×2 IMPLANT
SUT PROLENE 5 0 P 3 (SUTURE) IMPLANT
SYR BULB 3OZ (MISCELLANEOUS) ×2 IMPLANT
SYR CONTROL 10ML LL (SYRINGE) ×2 IMPLANT
TOWEL OR 17X24 6PK STRL BLUE (TOWEL DISPOSABLE) ×2 IMPLANT
TOWEL OR NON WOVEN STRL DISP B (DISPOSABLE) ×2 IMPLANT
UNDERPAD 30X30 (UNDERPADS AND DIAPERS) ×2 IMPLANT

## 2015-06-14 NOTE — Anesthesia Postprocedure Evaluation (Signed)
  Anesthesia Post-op Note  Patient: Patricia Hogan  Procedure(s) Performed: Procedure(s) with comments: RIGHT VOLAR WRIST MASS EXCISION WITH ARTHROTOMY AND SYNOVECTOMY AS NECESSARY (Right) - Dr. refused to send specimen preferring to send with patient.  Patient Location: PACU  Anesthesia Type:General  Level of Consciousness: awake, alert , oriented and patient cooperative  Airway and Oxygen Therapy: Patient Spontanous Breathing  Post-op Pain: none  Post-op Assessment: Post-op Vital signs reviewed, Patient's Cardiovascular Status Stable, Respiratory Function Stable, Patent Airway, No signs of Nausea or vomiting and Pain level controlled              Post-op Vital Signs: Reviewed and stable  Last Vitals:  Filed Vitals:   06/14/15 0915  BP: 120/73  Pulse: 89  Temp:   Resp: 14    Complications: No apparent anesthesia complications

## 2015-06-14 NOTE — Anesthesia Procedure Notes (Signed)
Procedure Name: LMA Insertion Date/Time: 06/14/2015 7:38 AM Performed by: Janaria Mccammon D Pre-anesthesia Checklist: Patient identified, Emergency Drugs available, Suction available and Patient being monitored Patient Re-evaluated:Patient Re-evaluated prior to inductionOxygen Delivery Method: Circle System Utilized Preoxygenation: Pre-oxygenation with 100% oxygen Intubation Type: IV induction Ventilation: Mask ventilation without difficulty LMA: LMA inserted LMA Size: 4.0 Number of attempts: 1 Airway Equipment and Method: Bite block Placement Confirmation: positive ETCO2 Tube secured with: Tape Dental Injury: Teeth and Oropharynx as per pre-operative assessment

## 2015-06-14 NOTE — Op Note (Signed)
See op note [id number] Indria Bishara Md

## 2015-06-14 NOTE — Anesthesia Preprocedure Evaluation (Addendum)
Anesthesia Evaluation  Patient identified by MRN, date of birth, ID band Patient awake    Reviewed: Allergy & Precautions, NPO status , Patient's Chart, lab work & pertinent test results  History of Anesthesia Complications (+) PONV and history of anesthetic complications  Airway Mallampati: II  TM Distance: >3 FB Neck ROM: Full    Dental  (+) Dental Advisory Given, Teeth Intact   Pulmonary neg pulmonary ROS,  breath sounds clear to auscultation        Cardiovascular negative cardio ROS  Rhythm:Regular Rate:Normal     Neuro/Psych PSYCHIATRIC DISORDERS Anxiety Depression negative neurological ROS     GI/Hepatic negative GI ROS, Neg liver ROS,   Endo/Other  Morbid obesity  Renal/GU negative Renal ROS     Musculoskeletal   Abdominal (+) + obese,   Peds  Hematology negative hematology ROS (+)   Anesthesia Other Findings   Reproductive/Obstetrics LMP 5 days ago                            Anesthesia Physical Anesthesia Plan  ASA: II  Anesthesia Plan: General   Post-op Pain Management:    Induction: Intravenous  Airway Management Planned: LMA  Additional Equipment:   Intra-op Plan:   Post-operative Plan:   Informed Consent: I have reviewed the patients History and Physical, chart, labs and discussed the procedure including the risks, benefits and alternatives for the proposed anesthesia with the patient or authorized representative who has indicated his/her understanding and acceptance.   Dental advisory given  Plan Discussed with: CRNA and Surgeon  Anesthesia Plan Comments: (Plan routine monitors, GA- LMA ok)        Anesthesia Quick Evaluation

## 2015-06-14 NOTE — H&P (Signed)
Patricia Hogan is an 29 y.o. female.   Chief Complaint:patient presents for evaluation of her right wrist mass  HPI: patient presents for removal right wrist mass she denies other issues today. With a long preoperative discussion about all risk and benefits  Patient presents for evaluation and treatment of the of their upper extremity predicament. The patient denies neck, back, chest or  abdominal pain. The patient notes that they have no lower extremity problems. The patients primary complaint is noted. We are planning surgical care pathway for the upper extremity.  Past Medical History  Diagnosis Date  . Depression   . Anxiety   . PONV (postoperative nausea and vomiting)   . Mass of right wrist 05/2015    volar mass  . Family history of adverse reaction to anesthesia     pt's father has hx. of post-op N/V    Past Surgical History  Procedure Laterality Date  . Wisdom tooth extraction    . Breast enhancement surgery Bilateral 2004  . Breast surgery  2013    revision breast augmentation due to one ruptured implant    Family History  Problem Relation Age of Onset  . Diabetes Mellitus I Brother   . Hypertension Maternal Grandmother   . Diabetes Maternal Grandfather   . Breast cancer Paternal Grandmother 21  . Anesthesia problems Father     post-op N/V   Social History:  reports that she has never smoked. She has never used smokeless tobacco. She reports that she does not drink alcohol or use illicit drugs.  Allergies:  Allergies  Allergen Reactions  . Benzoyl Peroxide Swelling and Rash  . Sulfa Antibiotics Hives    Facility-administered medications prior to admission  Medication Dose Route Frequency Provider Last Rate Last Dose  . nystatin cream (MYCOSTATIN)   Topical BID Harrington Challenger, NP       Medications Prior to Admission  Medication Sig Dispense Refill  . ALPRAZolam (XANAX) 0.25 MG tablet Take 0.25 mg by mouth as needed for anxiety.    Marland Kitchen buPROPion (WELLBUTRIN XL)  300 MG 24 hr tablet Take 300 mg by mouth daily.    . drospirenone-ethinyl estradiol (YAZ,GIANVI,LORYNA) 3-0.02 MG tablet Take 1 tablet by mouth daily. 3 Package 4  . ibuprofen (ADVIL,MOTRIN) 200 MG tablet Take 800 mg by mouth every 6 (six) hours as needed for pain.    . traZODone (DESYREL) 100 MG tablet Take 100 mg by mouth at bedtime.    Marland Kitchen venlafaxine (EFFEXOR) 75 MG tablet Take 75 mg by mouth daily.       No results found for this or any previous visit (from the past 48 hour(s)). No results found.  Review of Systems  Eyes: Negative.   Respiratory: Negative.   Gastrointestinal: Negative.   Genitourinary: Negative.   Neurological: Negative.   Psychiatric/Behavioral: Negative.     Blood pressure 127/76, pulse 68, temperature 98.3 F (36.8 C), temperature source Oral, resp. rate 16, height  (1.575 m), weight 82.725 kg (182 lb 6 oz), last menstrual period 06/08/2015, SpO2 99 %. Physical Exam  Right wrist mass and minimally painful with failure of conservative management. She's neurovascularly intact.  The patient is alert and oriented in no acute distress. The patient complains of pain in the affected upper extremity.  The patient is noted to have a normal HEENT exam. Lung Steinhardt show equal chest expansion and no shortness of breath. Abdomen exam is nontender without distention. Lower extremity examination does not show any fracture  dislocation or blood clot symptoms. Pelvis is stable and the neck and back are stable and nontender. Assessment/Plan We will plan for right wrist mass removal. I discussed all issues with the patient and she desires to proceed We are planning surgery for your upper extremity. The risk and benefits of surgery to include risk of bleeding, infection, anesthesia,  damage to normal structures and failure of the surgery to accomplish its intended goals of relieving symptoms and restoring function have been discussed in detail. With this in mind we plan to  proceed. I have specifically discussed with the patient the pre-and postoperative regime and the dos and don'ts and risk and benefits in great detail. Risk and benefits of surgery also include risk of dystrophy(CRPS), chronic nerve pain, failure of the healing process to go onto completion and other inherent risks of surgery The relavent the pathophysiology of the disease/injury process, as well as the alternatives for treatment and postoperative course of action has been discussed in great detail with the patient who desires to proceed.  We will do everything in our power to help you (the patient) restore function to the upper extremity. It is a pleasure to see this patient today.  Karen Chafe 06/14/2015, 7:28 AM

## 2015-06-14 NOTE — Discharge Instructions (Signed)
Please keep your bandage clean and dry. Please move your fingers frequently.  We recommend that you to take vitamin C 1000 mg a day to promote healing. We also recommend that if you require  pain medicine that you take a stool softener to prevent constipation as most pain medicines will have constipation side effects. We recommend either Peri-Colace or Senokot and recommend that you also consider adding MiraLAX to prevent the constipation affects from pain medicine if you are required to use them. These medicines are over the counter and maybe purchased at a local pharmacy. A cup of yogurt and a probiotic can also be helpful during the recovery process as the medicines can disrupt your intestinal environment. Keep bandage clean and dry.  Call for any problems.  No smoking.  Criteria for driving a car: you should be off your pain medicine for 7-8 hours, able to drive one handed(confident), thinking clearly and feeling able in your judgement to drive. Continue elevation as it will decrease swelling.  If instructed by MD move your fingers within the confines of the bandage/splint.  Use ice if instructed by your MD. Call immediately for any sudden loss of feeling in your hand/arm or change in functional abilities of the extremity.    Post Anesthesia Home Care Instructions  Activity: Get plenty of rest for the remainder of the day. A responsible adult should stay with you for 24 hours following the procedure.  For the next 24 hours, DO NOT: -Drive a car -Advertising copywriter -Drink alcoholic beverages -Take any medication unless instructed by your physician -Make any legal decisions or sign important papers.  Meals: Start with liquid foods such as gelatin or soup. Progress to regular foods as tolerated. Avoid greasy, spicy, heavy foods. If nausea and/or vomiting occur, drink only clear liquids until the nausea and/or vomiting subsides. Call your physician if vomiting continues.  Special  Instructions/Symptoms: Your throat may feel dry or sore from the anesthesia or the breathing tube placed in your throat during surgery. If this causes discomfort, gargle with warm salt water. The discomfort should disappear within 24 hours.  If you had a scopolamine patch placed behind your ear for the management of post- operative nausea and/or vomiting:  1. The medication in the patch is effective for 72 hours, after which it should be removed.  Wrap patch in a tissue and discard in the trash. Wash hands thoroughly with soap and water. 2. You may remove the patch earlier than 72 hours if you experience unpleasant side effects which may include dry mouth, dizziness or visual disturbances. 3. Avoid touching the patch. Wash your hands with soap and water after contact with the patch.

## 2015-06-14 NOTE — Transfer of Care (Signed)
Immediate Anesthesia Transfer of Care Note  Patient: Patricia Hogan  Procedure(s) Performed: Procedure(s) with comments: RIGHT VOLAR WRIST MASS EXCISION WITH ARTHROTOMY AND SYNOVECTOMY AS NECESSARY (Right) - Dr. refused to send specimen preferring to send with patient.  Patient Location: PACU  Anesthesia Type:General  Level of Consciousness: awake and patient cooperative  Airway & Oxygen Therapy: Patient Spontanous Breathing and Patient connected to face mask oxygen  Post-op Assessment: Report given to RN and Post -op Vital signs reviewed and stable  Post vital signs: Reviewed and stable  Last Vitals:  Filed Vitals:   06/14/15 0621  BP: 127/76  Pulse: 68  Temp: 36.8 C  Resp: 16    Complications: No apparent anesthesia complications

## 2015-06-14 NOTE — Op Note (Signed)
Patricia Hogan, Patricia Hogan              ACCOUNT NO.:  [id number]  MEDICAL RECORD NO.:  [id number]  LOCATION:                               FACILITY:  [facility]  PHYSICIAN:  Dionne Ano. Kalyna Paolella, M.D.DATE OF BIRTH:  [birth year]  DATE OF PROCEDURE:  06/14/2015 DATE OF DISCHARGE:                              OPERATIVE REPORT   PREOPERATIVE DIAGNOSIS:  Right volar radial wrist mass deep in location.  POSTOPERATIVE DIAGNOSIS:  Right volar radial wrist mass deep in location.  PROCEDURE: 1. Removal of a deep 1.5 cm/greater than 1.5 cm mass consistent with     ganglion type material, right volar radial wrist emanating from the     STT joint. 2. Arthrotomy synovectomy, STT joint.  SURGEON:  Dionne Ano. Amanda Pea, MD  ASSISTANT:  None.  COMPLICATION:  None.  ANESTHESIA:  General.  TOURNIQUET TIME:  Less than an hour.  INDICATIONS FOR PROCEDURE:  This patient is a 29 year old female who presents with the above-mentioned diagnosis and failure of conservative management.  I have discussed the risks, benefits, timeframe duration of recovery, and she desires to proceed.  OPERATIVE PROCEDURE:  The patient was seen by myself and Anesthesia, taken to the operative suite and underwent smooth induction of general anesthesia.  She was prepped and draped in usual sterile fashion with Betadine scrub and paint.  Following this, time-out was called.  Pre and postop checklist secured, and antibiotics have been given.  She then underwent a very careful and cautious sterile field isolation technique followed by incision under 250 mmHg tourniquet control.  Dissection was carried down, and a 1.5 cm/greater than 1.5 cm mass was removed from its origin at the STT joint.  Intraoperative photos were taken of this mass, and it looked completely benign with ganglion type characteristics, and thus I did not send it for pathology.  I excised it from its stalk origin at the STT joint. This was a deep mass removal at the  wrist region.  Following this, I then performed arthrotomy, synovectomy at the STT joint.  This was a wrist joint arthrotomy, synovectomy.  Capsular tearing was treated with bipolar electrocautery to decrease risk of recurrence, and the complete stalk origin was excised and debrided. There were no complicating features.  Following this, we irrigated with a liter of saline.  Deflated the tourniquet, secured hemostasis with bipolar electrocautery and closed wound with 4-0 and 5-0 Prolene.  Thumb spica splint was applied, and 10 mL of Sensorcaine was placed in the wound for postop analgesia.  She tolerated the procedure well.  There were no complicating features.  We will plan to see her back in the office in 10 days.  We are going to hold her still until 18 days and get therapy for motion massage and other measures including gentle interval strengthening.  I would predict a 4-6 week time-out of work, and I wrote this on FMLA/disability type form.  She was given intraoperative photos and the mass at her request.     Dionne Ano. Amanda Pea, M.D.     [facility]  D:  06/14/2015  T:  06/14/2015  Job:  [id number]

## 2015-06-17 ENCOUNTER — Encounter (HOSPITAL_BASED_OUTPATIENT_CLINIC_OR_DEPARTMENT_OTHER): Payer: Self-pay | Admitting: Orthopedic Surgery

## 2015-07-22 ENCOUNTER — Telehealth: Payer: Self-pay

## 2015-07-22 NOTE — Telephone Encounter (Signed)
Please call her and tell her to continue pills daily and just like you said possibly the antibiotic may be causing. If continued BTB next month to call. thanks

## 2015-07-22 NOTE — Telephone Encounter (Signed)
Patient advised.

## 2015-07-22 NOTE — Telephone Encounter (Signed)
Patient has been on Yaz for awhile. She said she never has BTB. This mos she has started bleeding a Week or so into pack and has been bleeding a week and a 1/2. She said she has to wear protection daily usually changing twice a day.  She said it is more aggravating than anything. She has not missed a pill or been late with a pill this pack and her last period was normal. Of note, she has been taking an antibiotic for a staph infection.  I reminded her about using back up birth control when taking antibiotic like this.  I told her I will forward this to [place] and she is off today. I will get back with her tomorrow and let her know what Harriett Sine recommends.

## 2015-11-13 ENCOUNTER — Encounter: Payer: Self-pay | Admitting: Women's Health

## 2015-11-13 ENCOUNTER — Ambulatory Visit (INDEPENDENT_AMBULATORY_CARE_PROVIDER_SITE_OTHER): Payer: 59 | Admitting: Women's Health

## 2015-11-13 VITALS — BP 118/80 | Wt 190.0 lb

## 2015-11-13 DIAGNOSIS — R350 Frequency of micturition: Secondary | ICD-10-CM | POA: Diagnosis not present

## 2015-11-13 DIAGNOSIS — N3001 Acute cystitis with hematuria: Secondary | ICD-10-CM

## 2015-11-13 DIAGNOSIS — N898 Other specified noninflammatory disorders of vagina: Secondary | ICD-10-CM

## 2015-11-13 LAB — URINALYSIS W MICROSCOPIC + REFLEX CULTURE
Bilirubin Urine: NEGATIVE
CASTS: NONE SEEN [LPF]
Crystals: NONE SEEN [HPF]
Glucose, UA: NEGATIVE
Ketones, ur: NEGATIVE
Leukocytes, UA: NEGATIVE
NITRITE: NEGATIVE
Protein, ur: NEGATIVE
SPECIFIC GRAVITY, URINE: 1.025 (ref 1.001–1.035)
Yeast: NONE SEEN [HPF]
pH: 5.5 (ref 5.0–8.0)

## 2015-11-13 LAB — WET PREP FOR TRICH, YEAST, CLUE
CLUE CELLS WET PREP: NONE SEEN
Trich, Wet Prep: NONE SEEN

## 2015-11-13 MED ORDER — FLUCONAZOLE 150 MG PO TABS: 150.0000 mg | ORAL_TABLET | Freq: Once | ORAL | Status: DC

## 2015-11-13 MED ORDER — NITROFURANTOIN MONOHYD MACRO 100 MG PO CAPS: 100.0000 mg | ORAL_CAPSULE | Freq: Two times a day (BID) | ORAL | Status: AC

## 2015-11-13 NOTE — Progress Notes (Signed)
Patient ID: Patricia Hogan, female   DOB: 03/11/1986, 29 y.o.   MRN: [id number] Presents with complaint of increased urinary frequency with pressure and discomfort at end of stream of urination, noted small amount of bright red blood with wiping for 1 day. Not sexually active, monthly cycle on Yaz.  Small amount of white discharge without odor or itching. History of kidney stones, denies back pain or fever. Reports urinary discomfort is more urinary tract infection than kidney stone related per past experience.  Exam: Appears well, came from work. No CVAT, abdomen soft nontender, external genitalia mild erythema, speculum exam scant white discharge wet prep positive for few yeast. Bimanual no CMT or adnexal tenderness. UA: +2 blood, negative leukocytes, 0-5 WBCs, 20-40 RBCs, many bacteria, 20-40 squamous epithelials  Probable UTI Yeast vaginitis  Plan: Macrobid twice daily for 7 days with food prescription, proper use given and reviewed culture pending. Reviewed importance of fluids. Diflucan 150 by mouth 1 dose. Instructed to call if continued symptoms. Recheck clean-catch UA in 2 weeks to be sure hematuria resolves.

## 2015-11-13 NOTE — Patient Instructions (Signed)
Monilial Vaginitis Vaginitis in a soreness, swelling and redness (inflammation) of the vagina and vulva. Monilial vaginitis is not a sexually transmitted infection. CAUSES  Yeast vaginitis is caused by yeast (candida) that is normally found in your vagina. With a yeast infection, the candida has overgrown in number to a point that upsets the chemical balance. SYMPTOMS   White, thick vaginal discharge.  Swelling, itching, redness and irritation of the vagina and possibly the lips of the vagina (vulva).  Burning or painful urination.  Painful intercourse. DIAGNOSIS  Things that may contribute to monilial vaginitis are:  Postmenopausal and virginal states.  Pregnancy.  Infections.  Being tired, sick or stressed, especially if you had monilial vaginitis in the past.  Diabetes. Good control will help lower the chance.  Birth control pills.  Tight fitting garments.  Using bubble bath, feminine sprays, douches or deodorant tampons.  Taking certain medications that kill germs (antibiotics).  Sporadic recurrence can occur if you become ill. TREATMENT  Your caregiver will give you medication.  There are several kinds of anti monilial vaginal creams and suppositories specific for monilial vaginitis. For recurrent yeast infections, use a suppository or cream in the vagina 2 times a week, or as directed.  Anti-monilial or steroid cream for the itching or irritation of the vulva may also be used. Get your caregiver's permission.  Painting the vagina with methylene blue solution may help if the monilial cream does not work.  Eating yogurt may help prevent monilial vaginitis. HOME CARE INSTRUCTIONS   Finish all medication as prescribed.  Do not have sex until treatment is completed or after your caregiver tells you it is okay.  Take warm sitz baths.  Do not douche.  Do not use tampons, especially scented ones.  Wear cotton underwear.  Avoid tight pants and panty  hose.  Tell your sexual partner that you have a yeast infection. They should go to their caregiver if they have symptoms such as mild rash or itching.  Your sexual partner should be treated as well if your infection is difficult to eliminate.  Practice safer sex. Use condoms.  Some vaginal medications cause latex condoms to fail. Vaginal medications that harm condoms are:  Cleocin cream.  Butoconazole (Femstat).  Terconazole (Terazol) vaginal suppository.  Miconazole (Monistat) (may be purchased over the counter). SEEK MEDICAL CARE IF:   You have a temperature by mouth above 102 F (38.9 C).  The infection is getting worse after 2 days of treatment.  The infection is not getting better after 3 days of treatment.  You develop blisters in or around your vagina.  You develop vaginal bleeding, and it is not your menstrual period.  You have pain when you urinate.  You develop intestinal problems.  You have pain with sexual intercourse.   This information is not intended to replace advice given to you by your health care provider. Make sure you discuss any questions you have with your health care provider.   Document Released: 08/19/2005 Document Revised: 02/01/2012 Document Reviewed: 05/13/2015 Elsevier Interactive Patient Education 2016 Elsevier Inc. Urinary Tract Infection Urinary tract infections (UTIs) can develop anywhere along your urinary tract. Your urinary tract is your body's drainage system for removing wastes and extra water. Your urinary tract includes two kidneys, two ureters, a bladder, and a urethra. Your kidneys are a pair of bean-shaped organs. Each kidney is about the size of your fist. They are located below your ribs, one on each side of your spine. CAUSES Infections are   caused by microbes, which are microscopic organisms, including fungi, viruses, and bacteria. These organisms are so small that they can only be seen through a microscope. Bacteria are  the microbes that most commonly cause UTIs. SYMPTOMS  Symptoms of UTIs may vary by age and gender of the patient and by the location of the infection. Symptoms in Patricia Hogan women typically include a frequent and intense urge to urinate and a painful, burning feeling in the bladder or urethra during urination. Older women and men are more likely to be tired, shaky, and weak and have muscle aches and abdominal pain. A fever may mean the infection is in your kidneys. Other symptoms of a kidney infection include pain in your back or sides below the ribs, nausea, and vomiting. DIAGNOSIS To diagnose a UTI, your caregiver will ask you about your symptoms. Your caregiver will also ask you to provide a urine sample. The urine sample will be tested for bacteria and white blood cells. White blood cells are made by your body to help fight infection. TREATMENT  Typically, UTIs can be treated with medication. Because most UTIs are caused by a bacterial infection, they usually can be treated with the use of antibiotics. The choice of antibiotic and length of treatment depend on your symptoms and the type of bacteria causing your infection. HOME CARE INSTRUCTIONS  If you were prescribed antibiotics, take them exactly as your caregiver instructs you. Finish the medication even if you feel better after you have only taken some of the medication.  Drink enough water and fluids to keep your urine clear or pale yellow.  Avoid caffeine, tea, and carbonated beverages. They tend to irritate your bladder.  Empty your bladder often. Avoid holding urine for long periods of time.  Empty your bladder before and after sexual intercourse.  After a bowel movement, women should cleanse from front to back. Use each tissue only once. SEEK MEDICAL CARE IF:   You have back pain.  You develop a fever.  Your symptoms do not begin to resolve within 3 days. SEEK IMMEDIATE MEDICAL CARE IF:   You have severe back pain or lower  abdominal pain.  You develop chills.  You have nausea or vomiting.  You have continued burning or discomfort with urination. MAKE SURE YOU:   Understand these instructions.  Will watch your condition.  Will get help right away if you are not doing well or get worse.   This information is not intended to replace advice given to you by your health care provider. Make sure you discuss any questions you have with your health care provider.   Document Released: 08/19/2005 Document Revised: 07/31/2015 Document Reviewed: 12/18/2011 Elsevier Interactive Patient Education 2016 Elsevier Inc.  

## 2015-11-14 LAB — URINE CULTURE
COLONY COUNT: NO GROWTH
ORGANISM ID, BACTERIA: NO GROWTH

## 2015-11-15 ENCOUNTER — Encounter: Payer: Self-pay | Admitting: Women's Health

## 2015-11-30 ENCOUNTER — Emergency Department (HOSPITAL_COMMUNITY): Payer: BLUE CROSS/BLUE SHIELD

## 2015-11-30 ENCOUNTER — Emergency Department (HOSPITAL_COMMUNITY)
Admission: EM | Admit: 2015-11-30 | Discharge: 2015-11-30 | Disposition: A | Discharge status: Home / Self Care | Payer: BLUE CROSS/BLUE SHIELD | Attending: Emergency Medicine | Admitting: Emergency Medicine

## 2015-11-30 ENCOUNTER — Encounter (HOSPITAL_COMMUNITY): Payer: Self-pay | Admitting: Emergency Medicine

## 2015-11-30 DIAGNOSIS — F329 Major depressive disorder, single episode, unspecified: Secondary | ICD-10-CM | POA: Insufficient documentation

## 2015-11-30 DIAGNOSIS — Y9289 Other specified places as the place of occurrence of the external cause: Secondary | ICD-10-CM | POA: Insufficient documentation

## 2015-11-30 DIAGNOSIS — S82831A Other fracture of upper and lower end of right fibula, initial encounter for closed fracture: Secondary | ICD-10-CM | POA: Diagnosis not present

## 2015-11-30 DIAGNOSIS — Z79899 Other long term (current) drug therapy: Secondary | ICD-10-CM | POA: Insufficient documentation

## 2015-11-30 DIAGNOSIS — S99911A Unspecified injury of right ankle, initial encounter: Secondary | ICD-10-CM | POA: Diagnosis present

## 2015-11-30 DIAGNOSIS — Y9323 Activity, snow (alpine) (downhill) skiing, snow boarding, sledding, tobogganing and snow tubing: Secondary | ICD-10-CM | POA: Insufficient documentation

## 2015-11-30 DIAGNOSIS — F419 Anxiety disorder, unspecified: Secondary | ICD-10-CM | POA: Insufficient documentation

## 2015-11-30 DIAGNOSIS — S82842A Displaced bimalleolar fracture of left lower leg, initial encounter for closed fracture: Secondary | ICD-10-CM

## 2015-11-30 DIAGNOSIS — Y998 Other external cause status: Secondary | ICD-10-CM | POA: Diagnosis not present

## 2015-11-30 MED ORDER — HYDROCODONE-ACETAMINOPHEN 5-325 MG PO TABS: 2.0000 | ORAL_TABLET | ORAL | Status: DC | PRN

## 2015-11-30 MED ORDER — IBUPROFEN 600 MG PO TABS: 600.0000 mg | ORAL_TABLET | Freq: Four times a day (QID) | ORAL | Status: DC | PRN

## 2015-11-30 NOTE — ED Notes (Signed)
Per pt, states she was sledding-injured right ankle-swelling and pain

## 2015-11-30 NOTE — Discharge Instructions (Signed)
Ankle Fracture A fracture is a break in a bone. The ankle joint is made up of three bones. These include the lower (distal)sections of your lower leg bones, called the tibia and fibula, along with a bone in your foot, called the talus. Depending on how bad the break is and if more than one ankle joint bone is broken, a cast or splint is used to protect and keep your injured bone from moving while it heals. Sometimes, surgery is required to help the fracture heal properly.  There are two general types of fractures:  Stable fracture. This includes a single fracture line through one bone, with no injury to ankle ligaments. A fracture of the talus that does not have any displacement (movement of the bone on either side of the fracture line) is also stable.  Unstable fracture. This includes more than one fracture line through one or more bones in the ankle joint. It also includes fractures that have displacement of the bone on either side of the fracture line. CAUSES  A direct blow to the ankle.   Quickly and severely twisting your ankle.  Trauma, such as a car accident or falling from a significant height. RISK FACTORS You may be at a higher risk of ankle fracture if:  You have certain medical conditions.  You are involved in high-impact sports.  You are involved in a high-impact car accident. SIGNS AND SYMPTOMS   Tender and swollen ankle.  Bruising around the injured ankle.  Pain on movement of the ankle.  Difficulty walking or putting weight on the ankle.  A cold foot below the site of the ankle injury. This can occur if the blood vessels passing through your injured ankle were also damaged.  Numbness in the foot below the site of the ankle injury. DIAGNOSIS  An ankle fracture is usually diagnosed with a physical exam and X-rays. A CT scan may also be required for complex fractures. TREATMENT  Stable fractures are treated with a cast or splint and using crutches to avoid putting  weight on your injured ankle. This is followed by an ankle strengthening program. Some patients require a special type of cast, depending on other medical problems they may have. Unstable fractures require surgery to ensure the bones heal properly. Your health care provider will tell you what type of fracture you have and the best treatment for your condition. HOME CARE INSTRUCTIONS   Review correct crutch use with your health care provider and use your crutches as directed. Safe use of crutches is extremely important. Misuse of crutches can cause you to fall or cause injury to nerves in your hands or armpits.  Do not put weight or pressure on the injured ankle until directed by your health care provider.  To lessen the swelling, keep the injured leg elevated while sitting or lying down.  Apply ice to the injured area:  Put ice in a plastic bag.  Place a towel between your cast and the bag.  Leave the ice on for 20 minutes, 2-3 times a day.  If you have a plaster or fiberglass cast:  Do not try to scratch the skin under the cast with any objects. This can increase your risk of skin infection.  Check the skin around the cast every day. You may put lotion on any red or sore areas.  Keep your cast dry and clean.  If you have a plaster splint:  Wear the splint as directed.  You may loosen the elastic   around the splint if your toes become numb, tingle, or turn cold or blue.  Do not put pressure on any part of your cast or splint; it may break. Rest your cast only on a pillow the first 24 hours until it is fully hardened.  Your cast or splint can be protected during bathing with a plastic bag sealed to your skin with medical tape. Do not lower the cast or splint into water.  Take medicines as directed by your health care provider. Only take over-the-counter or prescription medicines for pain, discomfort, or fever as directed by your health care provider.  Do not drive a vehicle until  your health care provider specifically tells you it is safe to do so.  If your health care provider has given you a follow-up appointment, it is very important to keep that appointment. Not keeping the appointment could result in a chronic or permanent injury, pain, and disability. If you have any problem keeping the appointment, call the facility for assistance. SEEK MEDICAL CARE IF: You develop increased swelling or discomfort. SEEK IMMEDIATE MEDICAL CARE IF:   Your cast gets damaged or breaks.  You have continued severe pain.  You develop new pain or swelling after the cast was put on.  Your skin or toenails below the injury turn blue or gray.  Your skin or toenails below the injury feel cold, numb, or have loss of sensitivity to touch.  There is a bad smell or pus draining from under the cast. MAKE SURE YOU:   Understand these instructions.  Will watch your condition.  Will get help right away if you are not doing well or get worse.   This information is not intended to replace advice given to you by your health care provider. Make sure you discuss any questions you have with your health care provider.   Document Released: 11/06/2000 Document Revised: 11/14/2013 Document Reviewed: 06/08/2013 Elsevier Interactive Patient Education 2016 Elsevier Inc.  

## 2015-11-30 NOTE — ED Provider Notes (Signed)
CSN: [id number]     Arrival date & time 11/30/15  1704 History   First MD Initiated Contact with Patient 11/30/15 1720     Chief Complaint  Patient presents with  . Ankle Pain     (Consider location/radiation/quality/duration/timing/severity/associated sxs/prior Treatment) Patient is a 30 y.o. female presenting with ankle pain. The history is provided by the patient. No language interpreter was used.  Ankle Pain Location:  Ankle Time since incident:  1 hour Injury: no   Ankle location:  R ankle Pain details:    Quality:  Aching   Radiates to:  Does not radiate   Severity:  Moderate   Onset quality:  Gradual   Duration:  1 day   Timing:  Constant   Progression:  Worsening Chronicity:  New Dislocation: no   Foreign body present:  No foreign bodies Prior injury to area:  No Relieved by:  Nothing Worsened by:  Nothing tried Ineffective treatments:  None tried Associated symptoms: swelling   Risk factors: no concern for non-accidental trauma     Past Medical History  Diagnosis Date  . Depression   . Anxiety   . PONV (postoperative nausea and vomiting)   . Mass of right wrist 05/2015    volar mass  . Family history of adverse reaction to anesthesia     pt's father has hx. of post-op N/V   Past Surgical History  Procedure Laterality Date  . Wisdom tooth extraction    . Breast enhancement surgery Bilateral 2004  . Breast surgery  2013    revision breast augmentation due to one ruptured implant  . Mass excision Right 06/14/2015    Procedure: RIGHT VOLAR WRIST MASS EXCISION WITH ARTHROTOMY AND SYNOVECTOMY AS NECESSARY;  Surgeon: Dominica Severin, MD;  Location: [facility];  Service: Orthopedics;  Laterality: Right;  Dr. refused to send specimen preferring to send with patient.   Family History  Problem Relation Age of Onset  . Diabetes Mellitus I Brother   . Hypertension Maternal Grandmother   . Diabetes Maternal Grandfather   . Breast cancer Paternal  Grandmother 15  . Anesthesia problems Father     post-op N/V   Social History  Substance Use Topics  . Smoking status: Never Smoker   . Smokeless tobacco: Never Used  . Alcohol Use: No   OB History    Gravida Para Term Preterm AB TAB SAB Ectopic Multiple Living   0              Review of Systems  Musculoskeletal: Positive for joint swelling.  All other systems reviewed and are negative.     Allergies  Benzoyl peroxide and Sulfa antibiotics  Home Medications   Prior to Admission medications   Medication Sig Start Date End Date Taking? Authorizing Provider  ALPRAZolam Prudy Feeler) 0.25 MG tablet Take 0.25 mg by mouth as needed for anxiety.    Historical Provider, MD  buPROPion (WELLBUTRIN XL) 300 MG 24 hr tablet Take 300 mg by mouth daily.    Historical Provider, MD  drospirenone-ethinyl estradiol (YAZ,GIANVI,LORYNA) 3-0.02 MG tablet Take 1 tablet by mouth daily. 05/03/15   Harrington Challenger, NP  fluconazole (DIFLUCAN) 150 MG tablet Take 1 tablet (150 mg total) by mouth once. 11/13/15   Harrington Challenger, NP  ibuprofen (ADVIL,MOTRIN) 200 MG tablet Take 800 mg by mouth every 6 (six) hours as needed for pain.    Historical Provider, MD  traZODone (DESYREL) 100 MG tablet Take 100 mg by mouth  at bedtime.    Historical Provider, MD  venlafaxine (EFFEXOR) 75 MG tablet Take 75 mg by mouth daily.     Historical Provider, MD   BP 134/75 mmHg  Pulse 80  Temp(Src) 98 F (36.7 C) (Oral)  Resp 16  SpO2 99%  LMP 11/07/2015 Physical Exam  Constitutional: She appears well-developed.  HENT:  Head: Normocephalic and atraumatic.  Eyes: Pupils are equal, round, and reactive to light.  Musculoskeletal: She exhibits tenderness.  Tender, swollen  Right lateral malleolus,  Pain with range of motion,  nv and ns intact.   Neurological: She is alert.  Skin: Skin is warm.  Psychiatric: She has a normal mood and affect.  Nursing note and vitals reviewed.   ED Course  Procedures (including critical care  time) Labs Review Labs Reviewed - No data to display  Imaging Review Dg Ankle Complete Right  11/30/2015  CLINICAL DATA:  Sledding injury with hyperflexion of foot and ankle with lateral ankle pain, initial encounter EXAM: RIGHT ANKLE - COMPLETE 3+ VIEW COMPARISON:  None. FINDINGS: There is a oblique fracture through the distal right fibula with associated soft tissue swelling. Only minimal displacement is noted. No tibial fracture is seen. No other focal abnormality is noted. IMPRESSION: Distal right fibular fracture with associated soft tissue swelling Electronically Signed   By: Alcide CleverMark  Lukens M.D.   On: 11/30/2015 18:08   I have personally reviewed and evaluated these images and lab results as part of my medical decision-making.   EKG Interpretation None      MDM  Pt counseled on fracture.  Posterior splint, crutches.   Pt advised to follow up with Orthopaedist  Hydrocodone Ibuprofen    Final diagnoses:  Pott's fracture (of distal fibula), left, closed, initial encounter        Elson AreasLeslie K Brodi Nery, PA-C 11/30/15 1834  Loren Raceravid Yelverton, MD 11/30/15 2049

## 2016-03-13 DIAGNOSIS — F429 Obsessive-compulsive disorder, unspecified: Secondary | ICD-10-CM | POA: Diagnosis not present

## 2016-03-20 ENCOUNTER — Other Ambulatory Visit: Payer: Self-pay | Admitting: Orthopaedic Surgery

## 2016-03-20 DIAGNOSIS — S82831G Other fracture of upper and lower end of right fibula, subsequent encounter for closed fracture with delayed healing: Secondary | ICD-10-CM | POA: Diagnosis not present

## 2016-03-20 DIAGNOSIS — M25571 Pain in right ankle and joints of right foot: Secondary | ICD-10-CM

## 2016-03-27 ENCOUNTER — Ambulatory Visit
Admission: RE | Admit: 2016-03-27 | Discharge: 2016-03-27 | Disposition: A | Discharge status: Home / Self Care | Payer: BLUE CROSS/BLUE SHIELD | Source: Ambulatory Visit | Attending: Orthopaedic Surgery | Admitting: Orthopaedic Surgery

## 2016-03-27 DIAGNOSIS — M25571 Pain in right ankle and joints of right foot: Secondary | ICD-10-CM

## 2016-03-27 DIAGNOSIS — S82831A Other fracture of upper and lower end of right fibula, initial encounter for closed fracture: Secondary | ICD-10-CM | POA: Diagnosis not present

## 2016-04-03 DIAGNOSIS — S82831G Other fracture of upper and lower end of right fibula, subsequent encounter for closed fracture with delayed healing: Secondary | ICD-10-CM | POA: Diagnosis not present

## 2016-04-23 DIAGNOSIS — L0201 Cutaneous abscess of face: Secondary | ICD-10-CM | POA: Diagnosis not present

## 2016-04-23 DIAGNOSIS — B9561 Methicillin susceptible Staphylococcus aureus infection as the cause of diseases classified elsewhere: Secondary | ICD-10-CM | POA: Diagnosis not present

## 2016-04-23 DIAGNOSIS — L2089 Other atopic dermatitis: Secondary | ICD-10-CM | POA: Diagnosis not present

## 2016-04-24 DIAGNOSIS — F429 Obsessive-compulsive disorder, unspecified: Secondary | ICD-10-CM | POA: Diagnosis not present

## 2016-05-04 ENCOUNTER — Other Ambulatory Visit: Payer: Self-pay

## 2016-05-04 DIAGNOSIS — Z3041 Encounter for surveillance of contraceptive pills: Secondary | ICD-10-CM

## 2016-05-04 MED ORDER — DROSPIRENONE-ETHINYL ESTRADIOL 3-0.02 MG PO TABS: 1.0000 | ORAL_TABLET | Freq: Every day | ORAL | Status: DC

## 2016-05-08 ENCOUNTER — Encounter: Payer: 59 | Admitting: Women's Health

## 2016-05-15 DIAGNOSIS — F429 Obsessive-compulsive disorder, unspecified: Secondary | ICD-10-CM | POA: Diagnosis not present

## 2016-05-29 ENCOUNTER — Encounter: Payer: Self-pay | Admitting: Women's Health

## 2016-05-29 ENCOUNTER — Ambulatory Visit (INDEPENDENT_AMBULATORY_CARE_PROVIDER_SITE_OTHER): Payer: BLUE CROSS/BLUE SHIELD | Admitting: Women's Health

## 2016-05-29 VITALS — BP 116/70 | Ht 63.5 in | Wt 182.0 lb

## 2016-05-29 DIAGNOSIS — Z3041 Encounter for surveillance of contraceptive pills: Secondary | ICD-10-CM | POA: Diagnosis not present

## 2016-05-29 DIAGNOSIS — Z13220 Encounter for screening for lipoid disorders: Secondary | ICD-10-CM | POA: Diagnosis not present

## 2016-05-29 DIAGNOSIS — Z01419 Encounter for gynecological examination (general) (routine) without abnormal findings: Secondary | ICD-10-CM

## 2016-05-29 DIAGNOSIS — Z833 Family history of diabetes mellitus: Secondary | ICD-10-CM | POA: Diagnosis not present

## 2016-05-29 DIAGNOSIS — Z113 Encounter for screening for infections with a predominantly sexual mode of transmission: Secondary | ICD-10-CM

## 2016-05-29 LAB — CBC WITH DIFFERENTIAL/PLATELET
BASOS ABS: 0 {cells}/uL (ref 0–200)
BASOS PCT: 0 %
EOS ABS: 160 {cells}/uL (ref 15–500)
Eosinophils Relative: 2 %
HEMATOCRIT: 40.3 % (ref 35.0–45.0)
Hemoglobin: 13.1 g/dL (ref 11.7–15.5)
LYMPHS PCT: 18 %
Lymphs Abs: 1440 cells/uL (ref 850–3900)
MCH: 30.1 pg (ref 27.0–33.0)
MCHC: 32.5 g/dL (ref 32.0–36.0)
MCV: 92.6 fL (ref 80.0–100.0)
MONO ABS: 480 {cells}/uL (ref 200–950)
MPV: 9.8 fL (ref 7.5–12.5)
Monocytes Relative: 6 %
Neutro Abs: 5920 cells/uL (ref 1500–7800)
Neutrophils Relative %: 74 %
Platelets: 349 10*3/uL (ref 140–400)
RBC: 4.35 MIL/uL (ref 3.80–5.10)
RDW: 12.8 % (ref 11.0–15.0)
WBC: 8 10*3/uL (ref 3.8–10.8)

## 2016-05-29 MED ORDER — DROSPIRENONE-ETHINYL ESTRADIOL 3-0.02 MG PO TABS: 1.0000 | ORAL_TABLET | Freq: Every day | ORAL | Status: DC

## 2016-05-29 NOTE — Addendum Note (Signed)
Addended by: Richardson ChiquitoWILKINSON, Dorothe Elmore S on: 05/29/2016 09:30 AM   Modules accepted: Orders

## 2016-05-29 NOTE — Progress Notes (Signed)
Patricia Hogan 03/31/1986 [id number]    History:    Presents for annual exam.  Monthly cycle on Yaz. Occasional right lower quadrant pain for the past month none today. 05/2015 right wrist ganglion cyst surgery. HSV-1 with rare outbreaks. Gardasil series completed. 2012 LGSIL on C&B normal Paps after. Fractured right tibia/ankle from sledding accident, needing surgery.  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  ROS:  A ROS was performed and pertinent positives and negatives are included.  Exam:  Filed Vitals:   05/29/16 0847  BP: 116/70    General appearance:  Normal Thyroid:  Symmetrical, normal in size, without palpable masses or nodularity. Respiratory  Auscultation:  Clear without wheezing or rhonchi Cardiovascular  Auscultation:  Regular rate, without rubs, murmurs or gallops  Edema/varicosities:  Not grossly evident Abdominal  Soft,nontender, without masses, guarding or rebound.  Liver/spleen:  No organomegaly noted  Hernia:  None appreciated  Skin  Inspection:  Grossly normal   Breasts: Examined lying and sitting.     Right: Without masses, retractions, discharge or axillary adenopathy.     Left: Without masses, retractions, discharge or axillary adenopathy. Gentitourinary   Inguinal/mons:  Normal without inguinal adenopathy  External genitalia:  Normal  BUS/Urethra/Skene's glands:  Normal  Vagina:  Normal  Cervix:  Normal  Uterus:  normal in size, shape and contour.  Midline and mobile  Adnexa/parametria:     Rt: Without masses or tenderness.   Lt: Without masses or tenderness.  Anus and perineum: Normal  Digital rectal exam: Normal sphincter tone without palpated masses or tenderness  Assessment/Plan:  30 y.o. S W[place] G0 for annual exam with intermittent right lower quadrant pain for the past month.  Monthly cycle on Yaz STD  screen Intermittent right lower quadrant pain 1 month Right Tibia/ankle fracture-impending surgery 2012 LGSIL Anxiety/depression-continues in counseling, meds   Plan: Yaz prescription, proper use, slight risk for blood clots and strokes reviewed. Instructed to stop month prior to surgery and stay off until full mobility. Currently not sexually active. Reviewed first month back on Yaz condoms encouraged and until permanent partner. SBE's, exercise, calcium rich diet, MVI daily encouraged. Instructed to call if intermittent right lower quadrant pain continues for ultrasound. CBC, lipid panel, glucose, UA, Pap, GC/Chlamydia, HIV, hep B, C, RPR. Pap Normal 2013 with negative high-risk HPV. Screening guidelines reviewed.  Harrington ChallengerYOUNG,NANCY J [facility], LLCWHNP, 9:21 AM 05/29/2016

## 2016-05-30 LAB — LIPID PANEL
CHOLESTEROL: 182 mg/dL (ref 125–200)
HDL: 66 mg/dL (ref >=46)
LDL Cholesterol: 82 mg/dL (ref <130)
TRIGLYCERIDES: 168 mg/dL — AB (ref <150)
Total CHOL/HDL Ratio: 2.8 Ratio (ref <=5.0)
VLDL: 34 mg/dL — AB (ref <30)

## 2016-05-30 LAB — URINALYSIS W MICROSCOPIC + REFLEX CULTURE
Bilirubin Urine: NEGATIVE
CASTS: NONE SEEN [LPF]
CRYSTALS: NONE SEEN [HPF]
Glucose, UA: NEGATIVE
Ketones, ur: NEGATIVE
NITRITE: NEGATIVE
PH: 6 (ref 5.0–8.0)
PROTEIN: NEGATIVE
Specific Gravity, Urine: 1.022 (ref 1.001–1.035)
YEAST: NONE SEEN [HPF]

## 2016-05-30 LAB — HEPATITIS B SURFACE ANTIGEN: Hepatitis B Surface Ag: NEGATIVE

## 2016-05-30 LAB — GLUCOSE, RANDOM: Glucose, Bld: 82 mg/dL (ref 65–99)

## 2016-05-30 LAB — HEPATITIS C ANTIBODY: HCV AB: NEGATIVE

## 2016-05-30 LAB — RPR

## 2016-05-30 LAB — HIV ANTIBODY (ROUTINE TESTING W REFLEX): HIV: NONREACTIVE

## 2016-05-31 LAB — URINE CULTURE

## 2016-06-01 ENCOUNTER — Encounter: Payer: Self-pay | Admitting: Women's Health

## 2016-06-01 LAB — PAP IG W/ RFLX HPV ASCU

## 2016-06-01 LAB — GC/CHLAMYDIA PROBE AMP
CT Probe RNA: NOT DETECTED
GC PROBE AMP APTIMA: NOT DETECTED

## 2016-06-04 ENCOUNTER — Encounter: Payer: Self-pay | Admitting: Women's Health

## 2016-06-26 DIAGNOSIS — F429 Obsessive-compulsive disorder, unspecified: Secondary | ICD-10-CM | POA: Diagnosis not present

## 2016-07-03 DIAGNOSIS — M25571 Pain in right ankle and joints of right foot: Secondary | ICD-10-CM | POA: Diagnosis not present

## 2016-07-17 DIAGNOSIS — F429 Obsessive-compulsive disorder, unspecified: Secondary | ICD-10-CM | POA: Diagnosis not present

## 2016-07-30 DIAGNOSIS — S8261XA Displaced fracture of lateral malleolus of right fibula, initial encounter for closed fracture: Secondary | ICD-10-CM | POA: Diagnosis not present

## 2016-07-30 DIAGNOSIS — Y999 Unspecified external cause status: Secondary | ICD-10-CM | POA: Diagnosis not present

## 2016-07-30 DIAGNOSIS — G8918 Other acute postprocedural pain: Secondary | ICD-10-CM | POA: Diagnosis not present

## 2016-07-30 DIAGNOSIS — S8264XK Nondisplaced fracture of lateral malleolus of right fibula, subsequent encounter for closed fracture with nonunion: Secondary | ICD-10-CM | POA: Diagnosis not present

## 2016-07-30 HISTORY — PX: ANKLE SURGERY: SHX546

## 2016-08-10 DIAGNOSIS — S8264XK Nondisplaced fracture of lateral malleolus of right fibula, subsequent encounter for closed fracture with nonunion: Secondary | ICD-10-CM | POA: Diagnosis not present

## 2016-08-13 DIAGNOSIS — F429 Obsessive-compulsive disorder, unspecified: Secondary | ICD-10-CM | POA: Diagnosis not present

## 2016-08-28 DIAGNOSIS — M25571 Pain in right ankle and joints of right foot: Secondary | ICD-10-CM | POA: Diagnosis not present

## 2016-08-28 DIAGNOSIS — S8264XK Nondisplaced fracture of lateral malleolus of right fibula, subsequent encounter for closed fracture with nonunion: Secondary | ICD-10-CM | POA: Diagnosis not present

## 2016-09-11 DIAGNOSIS — F429 Obsessive-compulsive disorder, unspecified: Secondary | ICD-10-CM | POA: Diagnosis not present

## 2016-09-24 DIAGNOSIS — H40033 Anatomical narrow angle, bilateral: Secondary | ICD-10-CM | POA: Diagnosis not present

## 2016-09-24 DIAGNOSIS — H04123 Dry eye syndrome of bilateral lacrimal glands: Secondary | ICD-10-CM | POA: Diagnosis not present

## 2016-09-25 DIAGNOSIS — S8264XK Nondisplaced fracture of lateral malleolus of right fibula, subsequent encounter for closed fracture with nonunion: Secondary | ICD-10-CM | POA: Diagnosis not present

## 2016-09-25 DIAGNOSIS — H578 Other specified disorders of eye and adnexa: Secondary | ICD-10-CM | POA: Diagnosis not present

## 2016-10-22 DIAGNOSIS — F429 Obsessive-compulsive disorder, unspecified: Secondary | ICD-10-CM | POA: Diagnosis not present

## 2016-10-23 DIAGNOSIS — M25512 Pain in left shoulder: Secondary | ICD-10-CM | POA: Diagnosis not present

## 2016-10-23 DIAGNOSIS — M25571 Pain in right ankle and joints of right foot: Secondary | ICD-10-CM | POA: Diagnosis not present

## 2016-10-30 DIAGNOSIS — M25571 Pain in right ankle and joints of right foot: Secondary | ICD-10-CM | POA: Diagnosis not present

## 2016-11-03 DIAGNOSIS — M25571 Pain in right ankle and joints of right foot: Secondary | ICD-10-CM | POA: Diagnosis not present

## 2016-11-04 DIAGNOSIS — M25571 Pain in right ankle and joints of right foot: Secondary | ICD-10-CM | POA: Diagnosis not present

## 2016-11-05 DIAGNOSIS — F429 Obsessive-compulsive disorder, unspecified: Secondary | ICD-10-CM | POA: Diagnosis not present

## 2016-11-10 DIAGNOSIS — M25571 Pain in right ankle and joints of right foot: Secondary | ICD-10-CM | POA: Diagnosis not present

## 2016-11-12 DIAGNOSIS — M25571 Pain in right ankle and joints of right foot: Secondary | ICD-10-CM | POA: Diagnosis not present

## 2016-11-18 DIAGNOSIS — M25571 Pain in right ankle and joints of right foot: Secondary | ICD-10-CM | POA: Diagnosis not present

## 2016-11-19 DIAGNOSIS — M25571 Pain in right ankle and joints of right foot: Secondary | ICD-10-CM | POA: Diagnosis not present

## 2016-11-20 DIAGNOSIS — M25571 Pain in right ankle and joints of right foot: Secondary | ICD-10-CM | POA: Diagnosis not present

## 2016-11-24 DIAGNOSIS — M25571 Pain in right ankle and joints of right foot: Secondary | ICD-10-CM | POA: Diagnosis not present

## 2016-11-27 DIAGNOSIS — M25571 Pain in right ankle and joints of right foot: Secondary | ICD-10-CM | POA: Diagnosis not present

## 2016-12-03 DIAGNOSIS — M25571 Pain in right ankle and joints of right foot: Secondary | ICD-10-CM | POA: Diagnosis not present

## 2016-12-08 DIAGNOSIS — M25571 Pain in right ankle and joints of right foot: Secondary | ICD-10-CM | POA: Diagnosis not present

## 2016-12-11 DIAGNOSIS — F429 Obsessive-compulsive disorder, unspecified: Secondary | ICD-10-CM | POA: Diagnosis not present

## 2016-12-15 DIAGNOSIS — M25571 Pain in right ankle and joints of right foot: Secondary | ICD-10-CM | POA: Diagnosis not present

## 2016-12-17 DIAGNOSIS — M25571 Pain in right ankle and joints of right foot: Secondary | ICD-10-CM | POA: Diagnosis not present

## 2016-12-22 DIAGNOSIS — M25571 Pain in right ankle and joints of right foot: Secondary | ICD-10-CM | POA: Diagnosis not present

## 2016-12-24 DIAGNOSIS — M25571 Pain in right ankle and joints of right foot: Secondary | ICD-10-CM | POA: Diagnosis not present

## 2016-12-25 DIAGNOSIS — M25571 Pain in right ankle and joints of right foot: Secondary | ICD-10-CM | POA: Diagnosis not present

## 2016-12-29 DIAGNOSIS — M25571 Pain in right ankle and joints of right foot: Secondary | ICD-10-CM | POA: Diagnosis not present

## 2017-01-05 DIAGNOSIS — M25571 Pain in right ankle and joints of right foot: Secondary | ICD-10-CM | POA: Diagnosis not present

## 2017-01-07 DIAGNOSIS — M25571 Pain in right ankle and joints of right foot: Secondary | ICD-10-CM | POA: Diagnosis not present

## 2017-01-08 DIAGNOSIS — F429 Obsessive-compulsive disorder, unspecified: Secondary | ICD-10-CM | POA: Diagnosis not present

## 2017-01-12 DIAGNOSIS — M25571 Pain in right ankle and joints of right foot: Secondary | ICD-10-CM | POA: Diagnosis not present

## 2017-01-13 DIAGNOSIS — M25571 Pain in right ankle and joints of right foot: Secondary | ICD-10-CM | POA: Diagnosis not present

## 2017-01-19 DIAGNOSIS — M25571 Pain in right ankle and joints of right foot: Secondary | ICD-10-CM | POA: Diagnosis not present

## 2017-01-21 DIAGNOSIS — M25571 Pain in right ankle and joints of right foot: Secondary | ICD-10-CM | POA: Diagnosis not present

## 2017-01-22 DIAGNOSIS — H1013 Acute atopic conjunctivitis, bilateral: Secondary | ICD-10-CM | POA: Diagnosis not present

## 2017-01-22 DIAGNOSIS — M25571 Pain in right ankle and joints of right foot: Secondary | ICD-10-CM | POA: Diagnosis not present

## 2017-01-27 DIAGNOSIS — M25571 Pain in right ankle and joints of right foot: Secondary | ICD-10-CM | POA: Diagnosis not present

## 2017-02-04 DIAGNOSIS — M25571 Pain in right ankle and joints of right foot: Secondary | ICD-10-CM | POA: Diagnosis not present

## 2017-02-09 DIAGNOSIS — M25571 Pain in right ankle and joints of right foot: Secondary | ICD-10-CM | POA: Diagnosis not present

## 2017-02-12 DIAGNOSIS — F429 Obsessive-compulsive disorder, unspecified: Secondary | ICD-10-CM | POA: Diagnosis not present

## 2017-02-16 DIAGNOSIS — M25571 Pain in right ankle and joints of right foot: Secondary | ICD-10-CM | POA: Diagnosis not present

## 2017-02-26 DIAGNOSIS — Z0000 Encounter for general adult medical examination without abnormal findings: Secondary | ICD-10-CM | POA: Diagnosis not present

## 2017-03-05 DIAGNOSIS — F3289 Other specified depressive episodes: Secondary | ICD-10-CM | POA: Diagnosis not present

## 2017-03-05 DIAGNOSIS — M25571 Pain in right ankle and joints of right foot: Secondary | ICD-10-CM | POA: Diagnosis not present

## 2017-03-05 DIAGNOSIS — E663 Overweight: Secondary | ICD-10-CM | POA: Diagnosis not present

## 2017-03-05 DIAGNOSIS — F418 Other specified anxiety disorders: Secondary | ICD-10-CM | POA: Diagnosis not present

## 2017-03-05 DIAGNOSIS — Z0000 Encounter for general adult medical examination without abnormal findings: Secondary | ICD-10-CM | POA: Diagnosis not present

## 2017-03-05 DIAGNOSIS — Z1389 Encounter for screening for other disorder: Secondary | ICD-10-CM | POA: Diagnosis not present

## 2017-03-12 DIAGNOSIS — F429 Obsessive-compulsive disorder, unspecified: Secondary | ICD-10-CM | POA: Diagnosis not present

## 2017-04-08 DIAGNOSIS — F429 Obsessive-compulsive disorder, unspecified: Secondary | ICD-10-CM | POA: Diagnosis not present

## 2017-04-14 ENCOUNTER — Telehealth: Payer: Self-pay | Admitting: *Deleted

## 2017-04-14 NOTE — Telephone Encounter (Signed)
Pt called c/o UTI pressure, lower back discomfort, burning with urination, not able to come in for OV today due to work. Pt asked if you would be willing to prescribe RX? Please advise

## 2017-04-14 NOTE — Telephone Encounter (Signed)
Pt called back stating she called her PCP and they have prescribed her medication

## 2017-05-06 DIAGNOSIS — F429 Obsessive-compulsive disorder, unspecified: Secondary | ICD-10-CM | POA: Diagnosis not present

## 2017-05-10 DIAGNOSIS — Z6832 Body mass index (BMI) 32.0-32.9, adult: Secondary | ICD-10-CM | POA: Diagnosis not present

## 2017-05-10 DIAGNOSIS — R51 Headache: Secondary | ICD-10-CM | POA: Diagnosis not present

## 2017-05-10 DIAGNOSIS — F418 Other specified anxiety disorders: Secondary | ICD-10-CM | POA: Diagnosis not present

## 2017-05-10 DIAGNOSIS — R079 Chest pain, unspecified: Secondary | ICD-10-CM | POA: Diagnosis not present

## 2017-06-25 DIAGNOSIS — F429 Obsessive-compulsive disorder, unspecified: Secondary | ICD-10-CM | POA: Diagnosis not present

## 2017-07-09 ENCOUNTER — Other Ambulatory Visit: Payer: Self-pay | Admitting: *Deleted

## 2017-07-09 DIAGNOSIS — Z3041 Encounter for surveillance of contraceptive pills: Secondary | ICD-10-CM

## 2017-07-09 MED ORDER — DROSPIRENONE-ETHINYL ESTRADIOL 3-0.02 MG PO TABS: 1.0000 | ORAL_TABLET | Freq: Every day | ORAL | 0 refills | Status: DC

## 2017-07-15 DIAGNOSIS — H00021 Hordeolum internum right upper eyelid: Secondary | ICD-10-CM | POA: Diagnosis not present

## 2017-07-16 DIAGNOSIS — L700 Acne vulgaris: Secondary | ICD-10-CM | POA: Diagnosis not present

## 2017-07-28 ENCOUNTER — Ambulatory Visit (INDEPENDENT_AMBULATORY_CARE_PROVIDER_SITE_OTHER): Payer: BLUE CROSS/BLUE SHIELD | Admitting: Women's Health

## 2017-07-28 ENCOUNTER — Encounter: Payer: Self-pay | Admitting: Women's Health

## 2017-07-28 VITALS — BP 118/80 | Ht 63.0 in | Wt 191.0 lb

## 2017-07-28 DIAGNOSIS — Z3041 Encounter for surveillance of contraceptive pills: Secondary | ICD-10-CM | POA: Diagnosis not present

## 2017-07-28 DIAGNOSIS — Z01419 Encounter for gynecological examination (general) (routine) without abnormal findings: Secondary | ICD-10-CM | POA: Diagnosis not present

## 2017-07-28 DIAGNOSIS — B3731 Acute candidiasis of vulva and vagina: Secondary | ICD-10-CM

## 2017-07-28 DIAGNOSIS — B373 Candidiasis of vulva and vagina: Secondary | ICD-10-CM

## 2017-07-28 MED ORDER — DROSPIRENONE-ETHINYL ESTRADIOL 3-0.02 MG PO TABS: 1.0000 | ORAL_TABLET | Freq: Every day | ORAL | 4 refills | Status: DC

## 2017-07-28 MED ORDER — FLUCONAZOLE 150 MG PO TABS: 150.0000 mg | ORAL_TABLET | Freq: Once | ORAL | 1 refills | Status: AC

## 2017-07-28 NOTE — Progress Notes (Signed)
Patricia LazierKameron M Hogan 04/14/1986 [id number]    History:    Presents for annual exam.  Monthly cycle on Yaz. History of HSV-1. Not sexually active denies need for STD screen. 2012 CIN-1 normal Paps after. Gardasil completed. Recently treated with antibiotics and steroids for eye infection.  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  ROS:  A ROS was performed and pertinent positives and negatives are included.  Exam:  Vitals:   07/28/17 1205  BP: 118/80  Weight: 191 lb (86.6 kg)  Height: 5\' 3"  (1.6 m)   Body mass index is 33.83 kg/m.   General appearance:  Normal Thyroid:  Symmetrical, normal in size, without palpable masses or nodularity. Respiratory  Auscultation:  Clear without wheezing or rhonchi Cardiovascular  Auscultation:  Regular rate, without rubs, murmurs or gallops  Edema/varicosities:  Not grossly evident Abdominal  Soft,nontender, without masses, guarding or rebound.  Liver/spleen:  No organomegaly noted  Hernia:  None appreciated  Skin  Inspection:  Grossly normal   Breasts: Examined lying and sitting.     Right: Without masses, retractions, discharge or axillary adenopathy.     Left: Without masses, retractions, discharge or axillary adenopathy. Gentitourinary   Inguinal/mons:  Normal without inguinal adenopathy  External genitalia:  Normal  BUS/Urethra/Skene's glands:  Normal  Vagina:  Normal  Cervix:  Normal  Uterus:   normal in size, shape and contour.  Midline and mobile  Adnexa/parametria:     Rt: Without masses or tenderness.   Lt: Without masses or tenderness.  Anus and perineum: Normal  Digital rectal exam: Normal sphincter tone without palpated masses or tenderness  Assessment/Plan:  31 y.o. S WF G0 for annual exam with no complaints.  Light monthly cycle on  Yaz Obese Anxiety/depression-Labs-primary care + labs - reports as normal 2012 CIN-1  Plan: Diflucan 150 times one dose with refill, currently on antibiotics for eye infection. Yaz prescription, proper use, slight risk for blood clots and strokes. Condoms as needed. SBE's, exercise, calcium rich diet, MVI daily encouraged. Reviewed importance of increasing exercise and decreasing calories/carbs for weight loss. Pap with HR HPV typing, new screening guidelines reviewed.    Harrington Challengerancy J Acacia Latorre [facility] JosephWHNP, 12:39 PM 07/28/2017

## 2017-07-28 NOTE — Addendum Note (Signed)
Addended by: Kem ParkinsonBARNES, Cresta Riden on: 07/28/2017 12:48 PM   Modules accepted: Orders

## 2017-07-28 NOTE — Patient Instructions (Signed)

## 2017-07-29 LAB — PAP, TP IMAGING W/ HPV RNA, RFLX HPV TYPE 16,18/45: HPV DNA HIGH RISK: NOT DETECTED

## 2017-08-21 ENCOUNTER — Encounter: Payer: Self-pay | Admitting: Women's Health

## 2017-08-27 DIAGNOSIS — F429 Obsessive-compulsive disorder, unspecified: Secondary | ICD-10-CM | POA: Diagnosis not present

## 2017-08-31 ENCOUNTER — Encounter: Payer: Self-pay | Admitting: Women's Health

## 2017-08-31 ENCOUNTER — Ambulatory Visit (INDEPENDENT_AMBULATORY_CARE_PROVIDER_SITE_OTHER): Payer: BLUE CROSS/BLUE SHIELD | Admitting: Women's Health

## 2017-08-31 VITALS — BP 116/74

## 2017-08-31 DIAGNOSIS — B081 Molluscum contagiosum: Secondary | ICD-10-CM

## 2017-08-31 MED ORDER — ACYCLOVIR 5 % EX OINT: 1.0000 "application " | TOPICAL_OINTMENT | CUTANEOUS | 1 refills | Status: DC

## 2017-08-31 NOTE — Patient Instructions (Signed)
Molluscum Contagiosum, Adult Molluscum contagiosum is a skin infection that can cause a rash. When molluscum contagiosum affects the genital area, it is called a sexually transmitted disease (STD). What are the causes? Molluscum contagiosum is caused by a virus. The virus can spread easily from person to person through:  Skin-to-skin contact with an infected person.  Contact with an infected object, such as a towel or clothing.  What increases the risk? You may be at higher risk for molluscum contagiosum if you:  Live in an area where the weather is moist and warm.  Have a weak body defense system (immune system).  What are the signs or symptoms? The main symptom is a rash that appears 2-7 weeks after exposure to the virus. It is made up of 2-20 small, firm, dome-shaped bumps that may:  Be pink or flesh-colored.  Appear alone or in groups.  Range from the size of a pinhead to the size of a pencil eraser.  Feel smooth and waxy.  Have a pit in the middle.  Itch. The rash does not itch for most people.  The bumps often appears on the genitals, thighs, face, neck, and abdomen. How is this diagnosed? A health care provider can usually diagnose molluscum contagiosum by looking at the bumps on your skin. To confirm the diagnosis, your health care provider may scrape the bumps to collect a skin sample to examine under a microscope. How is this treated? The bumps may go away on their own, but people often have treatment to keep the virus from infecting someone else or to keep the rash from spreading to other body parts. Treatment may include:  Surgery to remove the bumps by freezing them (cryosurgery).  A procedure to scrape off the bumps (curettage).  A procedure to remove the bumps with a laser.  Putting medicine on the bumps (topical treatment).  Sometimes no treatment is needed. Follow these instructions at home:  Take medicines only as directed by your health care  provider.  As long as you have bumps on your skin, the infection can spread to others and to other parts of your body. To prevent this from happening: ? Do not scratch the bumps. ? Do not share clothing or towels with others until the bumps disappear. ? Avoid close contact with others until the bumps disappear. This includes sexual contact. ? Wash your hands often. ? Cover the bumps with clothing or a bandage when you will be near other people. Contact a health care provider if:  The bumps are spreading.  The bumps are becoming red and sore.  The bumps have not gone away after 12 months. This information is not intended to replace advice given to you by your health care provider. Make sure you discuss any questions you have with your health care provider. Document Released: 06/06/2014 Document Revised: 04/16/2016 Document Reviewed: 04/18/2014 Elsevier Interactive Patient Education  2018 Elsevier Inc.  

## 2017-08-31 NOTE — Progress Notes (Signed)
Presents with complaint of painless small bumps with occasional mild itching on the labia for the past week, noticed after shaving and questions if they are genital warts. Denies vaginal discharge, urinary symptoms, abdominal pain or fever. Gardasil series completed. Monthly cycle on Yaz/not sexually active in greater than a year. History of anxiety/depression on Effexor and Wellbutrin per primary care.  Exam: Appears well but anxious. External genitalia on right labia majora several flesh-colored domed molluscum appearing lesions noted. No evidence of HSV or genital warts with exam.  Molluscum Anxiety/depression-primary care manages  Plan: Reviewed self limiting, often resolves without treatment. Prefers to do/try something. We'll try Zovirax ointment reviewed it is antiviral cream may help resolve quicker. Prevention reviewed. Open to air as able. Instructed to call if continued problems or increase in number.

## 2017-09-01 ENCOUNTER — Telehealth: Payer: Self-pay | Admitting: *Deleted

## 2017-09-01 ENCOUNTER — Other Ambulatory Visit: Payer: Self-pay | Admitting: Women's Health

## 2017-09-01 MED ORDER — IMIQUIMOD 5 % EX CREA: TOPICAL_CREAM | CUTANEOUS | 1 refills | Status: DC

## 2017-09-01 MED ORDER — ACYCLOVIR 5 % EX CREA: 1.0000 "application " | TOPICAL_CREAM | CUTANEOUS | 1 refills | Status: DC

## 2017-09-01 NOTE — Telephone Encounter (Signed)
Patricia Hogan, it is a lot as it may not fix it. If you have time call [facility] pharmacy to see if  acyclovir cream is cheaper or could try Aldara 3 times daily, both are antiviral creams.

## 2017-09-01 NOTE — Telephone Encounter (Signed)
Acyclovir was sent [facility] at OV. Rx sent to Aldara 3 times daily to [facility]. Left on voicemail this has been done.

## 2017-09-01 NOTE — Telephone Encounter (Signed)
Patient was prescribed Acyclovir 5% at office visit, pt said medication will be $250 with insurance and $600 without insurance. She asked if another Rx can be prescribed? Please advise

## 2017-09-06 NOTE — Telephone Encounter (Signed)
Patient informed, pt doesn't believe Neosporin is going to work, she has tried in the past per patient. She asked if they could be frozen off? Please advise

## 2017-09-06 NOTE — Telephone Encounter (Addendum)
Patricia Hogan Prior authorization done Aldara it was denied stating treatment for this medication is for superficial basal cell carcinoma or genital or perianal warts. Neither medication aldara or acyclovir is covered drug. Pt said she is still having issues with this area. Please advise

## 2017-09-06 NOTE — Telephone Encounter (Signed)
If they are bothering her, I could apply TCA here in the office.

## 2017-09-06 NOTE — Telephone Encounter (Signed)
Have her try a topical antibiotic such as Neosporin twice daily, may help . It is self-limiting and should go away on its own.

## 2017-09-07 NOTE — Telephone Encounter (Signed)
Left detailed message on pt voicemail regarding this and told her to schedule appointment if she would like this done.

## 2017-09-30 DIAGNOSIS — F429 Obsessive-compulsive disorder, unspecified: Secondary | ICD-10-CM | POA: Diagnosis not present

## 2017-10-08 DIAGNOSIS — F429 Obsessive-compulsive disorder, unspecified: Secondary | ICD-10-CM | POA: Diagnosis not present

## 2017-10-18 ENCOUNTER — Ambulatory Visit (INDEPENDENT_AMBULATORY_CARE_PROVIDER_SITE_OTHER): Payer: BLUE CROSS/BLUE SHIELD | Admitting: Women's Health

## 2017-10-18 ENCOUNTER — Encounter: Payer: Self-pay | Admitting: Women's Health

## 2017-10-18 VITALS — BP 100/78

## 2017-10-18 DIAGNOSIS — B081 Molluscum contagiosum: Secondary | ICD-10-CM

## 2017-10-18 MED ORDER — ACYCLOVIR 200 MG PO CAPS
200.0000 mg | ORAL_CAPSULE | Freq: Every day | ORAL | 6 refills | Status: DC
Start: 2017-10-18 — End: 2017-11-12

## 2017-10-18 NOTE — Patient Instructions (Signed)
Molluscum Contagiosum, Adult Molluscum contagiosum is a skin infection that can cause a rash. When molluscum contagiosum affects the genital area, it is called a sexually transmitted disease (STD). What are the causes? Molluscum contagiosum is caused by a virus. The virus can spread easily from person to person through:  Skin-to-skin contact with an infected person.  Contact with an infected object, such as a towel or clothing.  What increases the risk? You may be at higher risk for molluscum contagiosum if you:  Live in an area where the weather is moist and warm.  Have a weak body defense system (immune system).  What are the signs or symptoms? The main symptom is a rash that appears 2-7 weeks after exposure to the virus. It is made up of 2-20 small, firm, dome-shaped bumps that may:  Be pink or flesh-colored.  Appear alone or in groups.  Range from the size of a pinhead to the size of a pencil eraser.  Feel smooth and waxy.  Have a pit in the middle.  Itch. The rash does not itch for most people.  The bumps often appears on the genitals, thighs, face, neck, and abdomen. How is this diagnosed? A health care provider can usually diagnose molluscum contagiosum by looking at the bumps on your skin. To confirm the diagnosis, your health care provider may scrape the bumps to collect a skin sample to examine under a microscope. How is this treated? The bumps may go away on their own, but people often have treatment to keep the virus from infecting someone else or to keep the rash from spreading to other body parts. Treatment may include:  Surgery to remove the bumps by freezing them (cryosurgery).  A procedure to scrape off the bumps (curettage).  A procedure to remove the bumps with a laser.  Putting medicine on the bumps (topical treatment).  Sometimes no treatment is needed. Follow these instructions at home:  Take medicines only as directed by your health care  provider.  As long as you have bumps on your skin, the infection can spread to others and to other parts of your body. To prevent this from happening: ? Do not scratch the bumps. ? Do not share clothing or towels with others until the bumps disappear. ? Avoid close contact with others until the bumps disappear. This includes sexual contact. ? Wash your hands often. ? Cover the bumps with clothing or a bandage when you will be near other people. Contact a health care provider if:  The bumps are spreading.  The bumps are becoming red and sore.  The bumps have not gone away after 12 months. This information is not intended to replace advice given to you by your health care provider. Make sure you discuss any questions you have with your health care provider. Document Released: 06/06/2014 Document Revised: 04/16/2016 Document Reviewed: 04/18/2014 Elsevier Interactive Patient Education  2018 Elsevier Inc.  

## 2017-10-18 NOTE — Progress Notes (Signed)
31 yo SWF G0 presents with complaint of painless small bumps on labia for the past month and recurrent fever blister. Seen on 08/31/2017 for bumps determined to be molluscum contagiosum self-limiting process reviewed but preferred medication.  prescribed (Aldara and Acyclovir 5%) was too expensive.Noticed bumps several weeks ago after shaving, reports mild vaginal pruritus. Denies vaginal discharge or urinary symptoms. Completed Gardasil series, currently taking Yaz, not sexually active for over 1 year. Fever blister currently in healing phase but reports frequent outbreaks. Medical history significant for 2012 CIN-1 with normal Paps after; also has anxiety/depression, managed by primary care.  Exam: Appears well but anxious. External genitalia: several flesh-colored, doom shaped lesions noted on right labia majoria. No evidence of HSV or genital warts. Applied lidocaine ointment and TCA 80% to lesions, patient tolerated the procedure well.  Molluscum contagiosum HSV 1  Plan: Again reviewed molluscum are self-limiting, reviewed TCA may not be effective for resolution. Acyclovir 200 mg 5 times daily at initial onset with fever blister outbreaks. Advised to apply Neosporin to labia and to leave open to air as able. Instructed to call or RTO if symptoms worsen or do not improve.

## 2017-11-12 ENCOUNTER — Telehealth: Payer: Self-pay | Admitting: *Deleted

## 2017-11-12 MED ORDER — ACYCLOVIR 200 MG PO CAPS: 200.0000 mg | ORAL_CAPSULE | Freq: Every day | ORAL | 6 refills | Status: AC

## 2017-11-12 MED ORDER — FLUCONAZOLE 150 MG PO TABS: 150.0000 mg | ORAL_TABLET | Freq: Once | ORAL | 0 refills | Status: AC

## 2017-11-12 NOTE — Telephone Encounter (Signed)
(   you are back up md) Patient called c/o itching and white discharge, asked if Diflucan tablet can be sent to pharmacy.

## 2017-11-12 NOTE — Telephone Encounter (Signed)
Okay for Diflucan 150 mg x 1 dose 

## 2017-11-12 NOTE — Telephone Encounter (Signed)
Patient asked for acyclovir 200 mg capsule be sent to [facility] as well.

## 2017-12-07 ENCOUNTER — Ambulatory Visit (INDEPENDENT_AMBULATORY_CARE_PROVIDER_SITE_OTHER): Payer: BLUE CROSS/BLUE SHIELD | Admitting: Women's Health

## 2017-12-07 ENCOUNTER — Encounter: Payer: Self-pay | Admitting: Women's Health

## 2017-12-07 VITALS — BP 122/80 | Ht 63.0 in | Wt 194.0 lb

## 2017-12-07 DIAGNOSIS — R300 Dysuria: Secondary | ICD-10-CM | POA: Diagnosis not present

## 2017-12-07 DIAGNOSIS — N390 Urinary tract infection, site not specified: Secondary | ICD-10-CM

## 2017-12-07 DIAGNOSIS — R319 Hematuria, unspecified: Secondary | ICD-10-CM | POA: Diagnosis not present

## 2017-12-07 LAB — URINALYSIS W MICROSCOPIC + REFLEX CULTURE
BILIRUBIN URINE: NEGATIVE
GLUCOSE, UA: NEGATIVE
Hyaline Cast: NONE SEEN /LPF
KETONES UR: NEGATIVE
Nitrites, Initial: NEGATIVE
PH: 6 (ref 5.0–8.0)
Protein, ur: NEGATIVE
Specific Gravity, Urine: 1.01 (ref 1.001–1.03)

## 2017-12-07 MED ORDER — NITROFURANTOIN MONOHYD MACRO 100 MG PO CAPS: 100.0000 mg | ORAL_CAPSULE | Freq: Two times a day (BID) | ORAL | 0 refills | Status: AC

## 2017-12-07 MED ORDER — PHENAZOPYRIDINE HCL 200 MG PO TABS: 200.0000 mg | ORAL_TABLET | Freq: Three times a day (TID) | ORAL | 0 refills | Status: DC | PRN

## 2017-12-07 NOTE — Progress Notes (Signed)
32 year old S WF G0 presents with complaint of urinary frequency, urgency, bladder pressure, pain and burning especially at end of stream of urination for 1 week. States saw visible blood in urine this a.m. Took several doses of an old unfinished prescription of Macrobid last week with good relief of symptoms but they have returned. Denies vaginal discharge, abdominal pain, or fever. Monthly cycle on Yaz, not sexually active. Anxiety and depression managed by psychiatrist.  Exam: Appears well. No CVAT. Abdomen soft without rebound or radiation slight discomfort at suprapubic area, external genitalia within normal limits, no visible discharge. UA: +3 blood, +2 leukocytes, 20-40 WBCs, 20-40 RBCs, many bacteria  UTI with hematuria  Plan: Macrobid twice daily for 7 days take with food, UTI prevention discussed. Instructed to call if symptoms do not resolve. Pyridium 200 mg 3 times daily when necessary prescription, proper use given and reviewed. Reviewed importance of increasing water intake.

## 2017-12-07 NOTE — Patient Instructions (Addendum)

## 2017-12-10 DIAGNOSIS — F429 Obsessive-compulsive disorder, unspecified: Secondary | ICD-10-CM | POA: Diagnosis not present

## 2018-01-14 DIAGNOSIS — M25551 Pain in right hip: Secondary | ICD-10-CM | POA: Diagnosis not present

## 2018-01-14 DIAGNOSIS — F429 Obsessive-compulsive disorder, unspecified: Secondary | ICD-10-CM | POA: Diagnosis not present

## 2018-01-19 DIAGNOSIS — M25551 Pain in right hip: Secondary | ICD-10-CM | POA: Diagnosis not present

## 2018-01-28 DIAGNOSIS — M1611 Unilateral primary osteoarthritis, right hip: Secondary | ICD-10-CM | POA: Diagnosis not present

## 2018-01-28 DIAGNOSIS — M25551 Pain in right hip: Secondary | ICD-10-CM | POA: Diagnosis not present

## 2018-02-18 IMAGING — CT CT ANKLE*R* W/O CM
2 of 3 series · 7 of 14 positions shown, 8 images · non-contrast
Comparison: X-ray right ankle 11/30/2015

CLINICAL DATA: Right ankle pain. Eval for nonunion right fibula.
Sledding injury [DATE].

EXAM:
CT OF THE RIGHT ANKLE WITHOUT CONTRAST
TECHNIQUE: Multidetector CT imaging of the right ankle was performed according
to the standard protocol. Multiplanar CT image reconstructions were
also generated.

[Series 5: lower ext soft · axial · 0.28mm/px · z∈[-83,+22]mm · 4 of 72 slices shown, 5 images]
[im 15/72  soft-tissue]
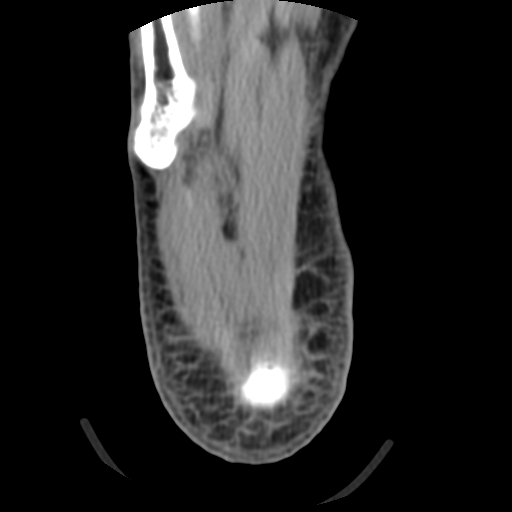
[im 15/72  bone]
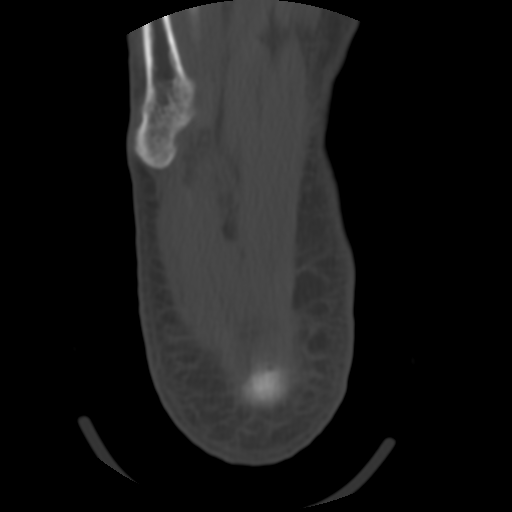
[im 29/72  bone]
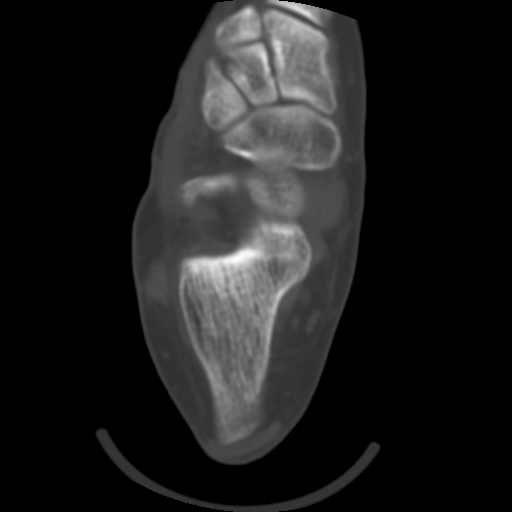
[im 43/72  bone]
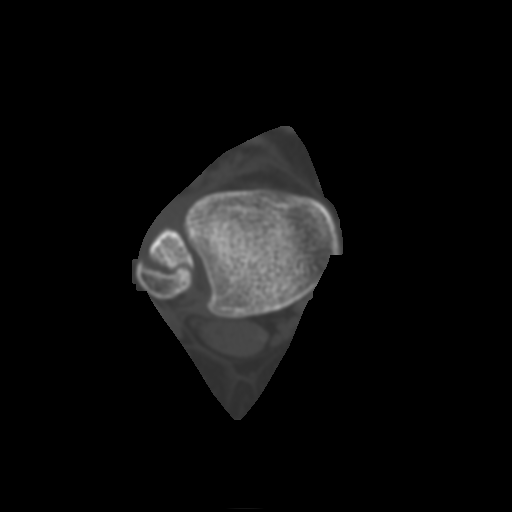
[im 57/72  bone]
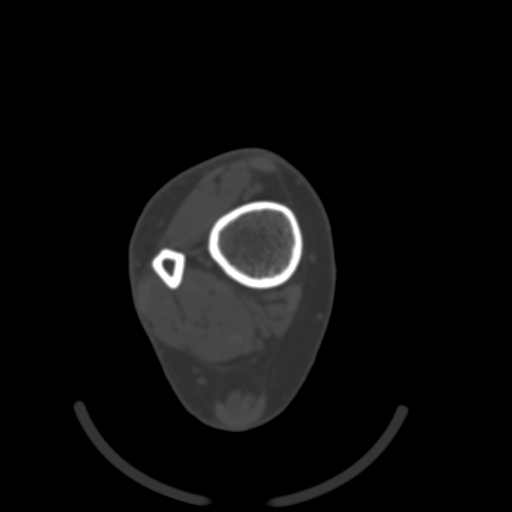

[Series 301: cor soft · coronal · 0.36mm/px · 3 of 60 slices shown]
[im 15/60  soft-tissue]
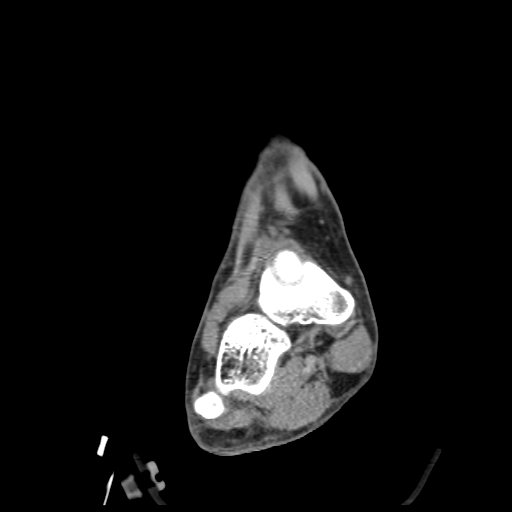
[im 30/60  soft-tissue]
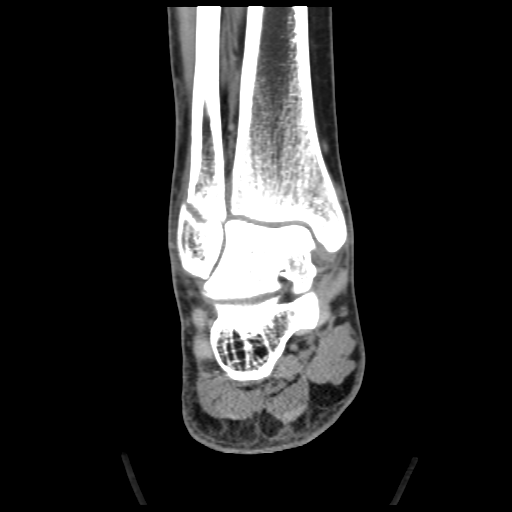
[im 45/60  soft-tissue]
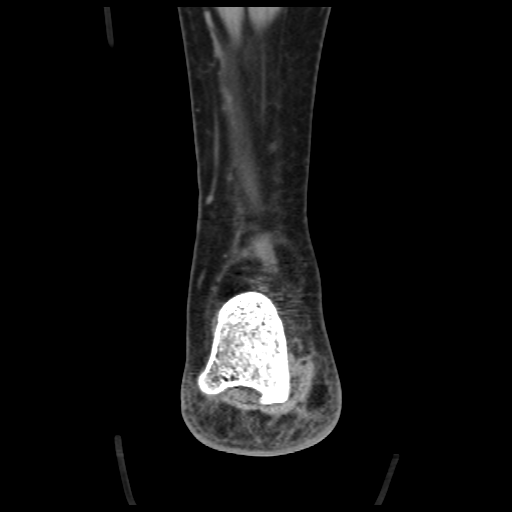

[7 of 14 positions shown; findings below may reference images not displayed]

FINDINGS: Bones/Joint

There is an ununited oblique fracture of the distal fibular
metaphysis with only a small area of bridging callus located along
the posterior lateral most aspect of the fracture cleft. A majority
of the fracture cleft demonstrates no osseous union.

No acute fracture or dislocation. Normal alignment. Small ankle
joint effusion. Normal ankle mortise. Normal subtalar joints.

Tendons
Visualized flexor, extensor, peroneal and Achilles tendons are
grossly intact. Plantar fascia is grossly intact.

Muscles

Normal.

Soft tissue
No fluid collection or hematoma.  No soft tissue mass.
IMPRESSION: 1. Ununited oblique fracture of the distal fibular metaphysis
consistent with nonunion.

## 2018-04-01 DIAGNOSIS — F429 Obsessive-compulsive disorder, unspecified: Secondary | ICD-10-CM | POA: Diagnosis not present

## 2018-04-26 ENCOUNTER — Encounter: Payer: Self-pay | Admitting: Women's Health

## 2018-04-28 ENCOUNTER — Telehealth: Payer: Self-pay

## 2018-04-28 ENCOUNTER — Other Ambulatory Visit: Payer: Self-pay | Admitting: Women's Health

## 2018-04-28 DIAGNOSIS — R5383 Other fatigue: Secondary | ICD-10-CM

## 2018-04-28 DIAGNOSIS — L659 Nonscarring hair loss, unspecified: Secondary | ICD-10-CM

## 2018-04-28 NOTE — Telephone Encounter (Signed)
TC message left 

## 2018-04-28 NOTE — Telephone Encounter (Signed)
"  I have been trying extremely hard to loose weight, eat healthy and been going to gym 4 to 5 times (some 2 times a day) per week and I am gaining weight, I stay tired and fatigued and my hair is falling out and getting SUPER thin, not cool! I am taking Iron, collagen, biotin, b vits. Every time you check my thyroid you guys say its fine, but something has got to give. I don't know if hormones are off like progesterone, estrogen, or if its my thyroid. My therapist and another Dr recommended [facility] is [facility] or can you guys do in DEPTH blood work and draws to see why weight gain, tired, fatigued and hair issues?"  Patient wants to know if you can order "in depth" blood work?

## 2018-04-28 NOTE — Telephone Encounter (Signed)
TC will come in for TSH, reviewed most likel not hormones of estrogen or progesterone. Will call to schedule lab appt

## 2018-05-10 ENCOUNTER — Other Ambulatory Visit: Payer: BLUE CROSS/BLUE SHIELD

## 2018-05-12 ENCOUNTER — Other Ambulatory Visit: Payer: BLUE CROSS/BLUE SHIELD

## 2018-05-12 DIAGNOSIS — L659 Nonscarring hair loss, unspecified: Secondary | ICD-10-CM

## 2018-05-12 DIAGNOSIS — R5383 Other fatigue: Secondary | ICD-10-CM

## 2018-05-13 LAB — TSH: TSH: 1.96 m[IU]/L

## 2018-05-31 ENCOUNTER — Ambulatory Visit (INDEPENDENT_AMBULATORY_CARE_PROVIDER_SITE_OTHER): Payer: BLUE CROSS/BLUE SHIELD | Admitting: Women's Health

## 2018-05-31 ENCOUNTER — Encounter: Payer: Self-pay | Admitting: Women's Health

## 2018-05-31 DIAGNOSIS — N61 Mastitis without abscess: Secondary | ICD-10-CM | POA: Diagnosis not present

## 2018-05-31 MED ORDER — DICLOXACILLIN SODIUM 250 MG PO CAPS: 250.0000 mg | ORAL_CAPSULE | Freq: Four times a day (QID) | ORAL | 0 refills | Status: DC

## 2018-05-31 MED ORDER — FLUCONAZOLE 150 MG PO TABS: 150.0000 mg | ORAL_TABLET | Freq: Once | ORAL | 0 refills | Status: AC

## 2018-05-31 NOTE — Patient Instructions (Signed)
Mastitis °Mastitis is redness, soreness, and puffiness (inflammation) in an area of the breast. It is often caused by an infection that occurs when bacteria enter the skin. The infection is often helped by antibiotic medicine. °Follow these instructions at home: °· Only take medicines as told by your doctor. °· If your doctor prescribed an antibiotic medicine, take it as told. Finish it even if you start to feel better. °· Do not wear a tight or underwire bra. Wear a soft support bra. °· Drink more fluids, especially if you have a fever. °· If you are breastfeeding: °? Keep emptying the breast. Your doctor can tell you if the milk is safe. Use a breast pump if you are told to stop nursing. °? Keep your nipples clean and dry. °? Empty the first breast before going to the other breast. Use a breast pump if your baby is not emptying your breast. °? If you go back to work, pump your breasts while at work. °? Avoid letting your breasts get overly filled with milk (engorged). °Contact a doctor if: °· You have pus-like fluid leaking from your breast. °· Your symptoms do not get better within 2 days. °Get help right away if: °· Your pain and puffiness are getting worse. °· Your pain is not helped by medicine. °· You have a red line going from your breast toward your armpit. °· You have a fever or lasting symptoms for more than 2-3 days. °· You have a fever and your symptoms suddenly get worse. °This information is not intended to replace advice given to you by your health care provider. Make sure you discuss any questions you have with your health care provider. °Document Released: 10/28/2009 Document Revised: 04/16/2016 Document Reviewed: 06/09/2013 °Elsevier Interactive Patient Education © 2017 Elsevier Inc. ° °

## 2018-05-31 NOTE — Progress Notes (Signed)
32 year old S WF G0 presents with complaint of left breast pain for the past 4 days.  States it woke her up at night, and is been a continual low ache with increased pain with palpation.  Denies any injury, or change in exercise routine.  Has been exercising 5 mornings a week for the past 3 months and continues to have difficulty losing weight despite exercise and low carb diet.  Denies vaginal discharge, urinary symptoms, abdominal pain, fever.  Not sexually active.  Monthly cycle on Yaz.  Anxiety depression stable on Effexor and Wellbutrin.  Exam: Appears well, came from work.  Breast examined sitting lying position, bilateral implants, right breast no visible dimpling, retractions, erythema, palpable nodules or nipple discharge.  Left breast at 12 o'clock position at the areola puckering and 2 to 3 cm erythema with 1 to 2 cm palpable very tender nodule.  No nipple discharge,  Probable low-grade left breast mastitis  Plan: Dicloxacillin 250 mg 4 times daily for 5 days.  Loose clothing, change immediately after exercise, warm soaks, instructed to call if no relief of symptoms.  Diflucan 150 p.o. x1 dose per request .  Reviewed if area does not resolve in a week we will proceed with diagnostic mammogram with ultrasound.   Agreeable with plan.

## 2018-06-01 ENCOUNTER — Ambulatory Visit: Payer: BLUE CROSS/BLUE SHIELD | Admitting: Women's Health

## 2018-06-20 DIAGNOSIS — F429 Obsessive-compulsive disorder, unspecified: Secondary | ICD-10-CM | POA: Diagnosis not present

## 2018-08-12 DIAGNOSIS — F429 Obsessive-compulsive disorder, unspecified: Secondary | ICD-10-CM | POA: Diagnosis not present

## 2018-08-19 DIAGNOSIS — Z0000 Encounter for general adult medical examination without abnormal findings: Secondary | ICD-10-CM | POA: Diagnosis not present

## 2018-08-26 DIAGNOSIS — R0789 Other chest pain: Secondary | ICD-10-CM | POA: Diagnosis not present

## 2018-08-26 DIAGNOSIS — M2550 Pain in unspecified joint: Secondary | ICD-10-CM | POA: Diagnosis not present

## 2018-08-26 DIAGNOSIS — Z0000 Encounter for general adult medical examination without abnormal findings: Secondary | ICD-10-CM | POA: Diagnosis not present

## 2018-08-26 DIAGNOSIS — R5383 Other fatigue: Secondary | ICD-10-CM | POA: Diagnosis not present

## 2018-08-26 DIAGNOSIS — Z1389 Encounter for screening for other disorder: Secondary | ICD-10-CM | POA: Diagnosis not present

## 2018-08-26 DIAGNOSIS — N951 Menopausal and female climacteric states: Secondary | ICD-10-CM | POA: Diagnosis not present

## 2018-08-29 ENCOUNTER — Other Ambulatory Visit: Payer: Self-pay | Admitting: Women's Health

## 2018-08-29 DIAGNOSIS — Z3041 Encounter for surveillance of contraceptive pills: Secondary | ICD-10-CM

## 2018-09-27 ENCOUNTER — Ambulatory Visit (INDEPENDENT_AMBULATORY_CARE_PROVIDER_SITE_OTHER): Payer: BLUE CROSS/BLUE SHIELD | Admitting: Women's Health

## 2018-09-27 ENCOUNTER — Encounter: Payer: Self-pay | Admitting: Women's Health

## 2018-09-27 VITALS — BP 120/70 | Ht 64.0 in | Wt 195.0 lb

## 2018-09-27 DIAGNOSIS — Z3041 Encounter for surveillance of contraceptive pills: Secondary | ICD-10-CM

## 2018-09-27 DIAGNOSIS — R350 Frequency of micturition: Secondary | ICD-10-CM

## 2018-09-27 DIAGNOSIS — Z01419 Encounter for gynecological examination (general) (routine) without abnormal findings: Secondary | ICD-10-CM

## 2018-09-27 MED ORDER — DROSPIRENONE-ETHINYL ESTRADIOL 3-0.02 MG PO TABS: 1.0000 | ORAL_TABLET | Freq: Every day | ORAL | 4 refills | Status: DC

## 2018-09-27 NOTE — Progress Notes (Signed)
Patricia Hogan May 14, 1986 [id number]    History:    Presents for annual exam.  Monthly cycle on Yaz.  2012 CIN-1 with normal Paps after.  2018 Pap normal with negative HR HPV.  Not sexually active for greater than 1 year , denies need for STD screen.  Anxiety/ depression managed by psychiatrist.  Has had increased hot flushes, poor sleep, hair loss, difficulty losing weight with normal labs per primary care.  Gardasil completed.  Past medical history, past surgical history, family history and social history were all reviewed and documented in the EPIC chart.  73-year-old niece that she is very involved with.  Sister-in-law with non-Hodgkin's lymphoma stable.  Brother type 1 diabetes.  In the process of becoming a foster parent.  ROS:  A ROS was performed and pertinent positives and negatives are included.  Exam:  Vitals:   09/27/18 1205  BP: 120/70  Weight: 195 lb (88.5 kg)  Height: 5\' 4"  (1.626 m)   Body mass index is 33.47 kg/m.   General appearance:  Normal Thyroid:  Symmetrical, normal in size, without palpable masses or nodularity. Respiratory  Auscultation:  Clear without wheezing or rhonchi Cardiovascular  Auscultation:  Regular rate, without rubs, murmurs or gallops  Edema/varicosities:  Not grossly evident Abdominal  Soft,nontender, without masses, guarding or rebound.  Liver/spleen:  No organomegaly noted  Hernia:  None appreciated  Skin  Inspection:  Grossly normal   Breasts: Examined lying and sitting.     Right: Without masses, retractions, discharge or axillary adenopathy.     Left: Without masses, retractions, discharge or axillary adenopathy. Gentitourinary   Inguinal/mons:  Normal without inguinal adenopathy  External genitalia:  Normal  BUS/Urethra/Skene's glands:  Normal  Vagina:  Normal  Cervix:  Normal  Uterus:   normal in size, shape and contour.  Midline and mobile  Adnexa/parametria:     Rt: Without masses or tenderness.   Lt: Without masses or  tenderness.  Anus and perineum: Normal  Digital rectal exam: Normal sphincter tone without palpated masses or tenderness  Assessment/Plan:  32 y.o. S WF G0 for annual exam with complaint of urine odor.  Monthly cycle on Yaz 2012 CIN-1 normal Paps after Obesity Anxiety/depression-psychiatrist manages counseling and meds Poor sleep/hair loss/difficulty losing weight-primary care managing labs  Plan: Yaz prescription, proper use, slight risk for blood clots and strokes reviewed.  Condoms encouraged if sexually active.  Sleep hygiene reviewed, SBE's, continue active lifestyle of regular exercise, calcium rich foods, MVI daily encouraged.  Pap, Pap normal with negative HR HPV 2018.   UA trace leukocytes, +2 blood, 6-10 WBCs, 10-20 RBCs, 10-20 squamous epithelials, moderate bacteria-urine culture pending    Harrington Challenger [facility], 12:52 PM 09/27/2018

## 2018-09-27 NOTE — Patient Instructions (Signed)

## 2018-09-27 NOTE — Addendum Note (Signed)
Addended by: Berna Spare A on: 09/27/2018 01:02 PM   Modules accepted: Orders

## 2018-09-28 LAB — PAP IG W/ RFLX HPV ASCU

## 2018-09-29 LAB — CULTURE INDICATED

## 2018-09-29 LAB — URINE CULTURE
MICRO NUMBER:: 91332410
RESULT: NO GROWTH
SPECIMEN QUALITY:: ADEQUATE

## 2018-09-29 LAB — URINALYSIS, COMPLETE W/RFL CULTURE
Bilirubin Urine: NEGATIVE
Glucose, UA: NEGATIVE
HYALINE CAST: NONE SEEN /LPF
KETONES UR: NEGATIVE
Nitrites, Initial: NEGATIVE
Protein, ur: NEGATIVE
SPECIFIC GRAVITY, URINE: 1.02 (ref 1.001–1.03)
pH: 5.5 (ref 5.0–8.0)

## 2018-10-11 DIAGNOSIS — F429 Obsessive-compulsive disorder, unspecified: Secondary | ICD-10-CM | POA: Diagnosis not present

## 2018-11-04 DIAGNOSIS — M25522 Pain in left elbow: Secondary | ICD-10-CM | POA: Diagnosis not present

## 2018-11-24 ENCOUNTER — Institutional Professional Consult (permissible substitution): Payer: BLUE CROSS/BLUE SHIELD | Admitting: Neurology

## 2018-12-09 DIAGNOSIS — M25522 Pain in left elbow: Secondary | ICD-10-CM | POA: Diagnosis not present

## 2018-12-21 DIAGNOSIS — M7712 Lateral epicondylitis, left elbow: Secondary | ICD-10-CM | POA: Diagnosis not present

## 2018-12-28 DIAGNOSIS — M7712 Lateral epicondylitis, left elbow: Secondary | ICD-10-CM | POA: Diagnosis not present

## 2018-12-29 DIAGNOSIS — M7712 Lateral epicondylitis, left elbow: Secondary | ICD-10-CM | POA: Diagnosis not present

## 2018-12-30 DIAGNOSIS — F429 Obsessive-compulsive disorder, unspecified: Secondary | ICD-10-CM | POA: Diagnosis not present

## 2019-01-04 DIAGNOSIS — M7712 Lateral epicondylitis, left elbow: Secondary | ICD-10-CM | POA: Diagnosis not present

## 2019-01-05 DIAGNOSIS — M7712 Lateral epicondylitis, left elbow: Secondary | ICD-10-CM | POA: Diagnosis not present

## 2019-01-10 DIAGNOSIS — M7712 Lateral epicondylitis, left elbow: Secondary | ICD-10-CM | POA: Diagnosis not present

## 2019-01-18 DIAGNOSIS — M7712 Lateral epicondylitis, left elbow: Secondary | ICD-10-CM | POA: Diagnosis not present

## 2019-01-24 DIAGNOSIS — M7712 Lateral epicondylitis, left elbow: Secondary | ICD-10-CM | POA: Diagnosis not present

## 2019-02-01 DIAGNOSIS — M7712 Lateral epicondylitis, left elbow: Secondary | ICD-10-CM | POA: Diagnosis not present

## 2019-02-10 DIAGNOSIS — M7712 Lateral epicondylitis, left elbow: Secondary | ICD-10-CM | POA: Diagnosis not present

## 2019-03-22 DIAGNOSIS — M7712 Lateral epicondylitis, left elbow: Secondary | ICD-10-CM | POA: Diagnosis not present

## 2019-05-26 DIAGNOSIS — Z20828 Contact with and (suspected) exposure to other viral communicable diseases: Secondary | ICD-10-CM | POA: Diagnosis not present

## 2019-08-25 DIAGNOSIS — E781 Pure hyperglyceridemia: Secondary | ICD-10-CM | POA: Diagnosis not present

## 2019-08-25 DIAGNOSIS — Z0000 Encounter for general adult medical examination without abnormal findings: Secondary | ICD-10-CM | POA: Diagnosis not present

## 2019-09-06 DIAGNOSIS — F429 Obsessive-compulsive disorder, unspecified: Secondary | ICD-10-CM | POA: Diagnosis not present

## 2019-09-08 DIAGNOSIS — Z0000 Encounter for general adult medical examination without abnormal findings: Secondary | ICD-10-CM | POA: Diagnosis not present

## 2019-09-08 DIAGNOSIS — F419 Anxiety disorder, unspecified: Secondary | ICD-10-CM | POA: Diagnosis not present

## 2019-09-08 DIAGNOSIS — Z9189 Other specified personal risk factors, not elsewhere classified: Secondary | ICD-10-CM | POA: Diagnosis not present

## 2019-09-08 DIAGNOSIS — E781 Pure hyperglyceridemia: Secondary | ICD-10-CM | POA: Diagnosis not present

## 2019-09-08 DIAGNOSIS — M2550 Pain in unspecified joint: Secondary | ICD-10-CM | POA: Diagnosis not present

## 2019-10-03 ENCOUNTER — Ambulatory Visit (INDEPENDENT_AMBULATORY_CARE_PROVIDER_SITE_OTHER): Payer: BC Managed Care – PPO | Admitting: Women's Health

## 2019-10-03 ENCOUNTER — Encounter: Payer: Self-pay | Admitting: Women's Health

## 2019-10-03 ENCOUNTER — Other Ambulatory Visit: Payer: Self-pay

## 2019-10-03 VITALS — BP 118/80 | Ht 64.0 in | Wt 171.0 lb

## 2019-10-03 DIAGNOSIS — N898 Other specified noninflammatory disorders of vagina: Secondary | ICD-10-CM | POA: Diagnosis not present

## 2019-10-03 DIAGNOSIS — Z01419 Encounter for gynecological examination (general) (routine) without abnormal findings: Secondary | ICD-10-CM | POA: Diagnosis not present

## 2019-10-03 DIAGNOSIS — Z3041 Encounter for surveillance of contraceptive pills: Secondary | ICD-10-CM

## 2019-10-03 LAB — WET PREP FOR TRICH, YEAST, CLUE

## 2019-10-03 MED ORDER — DROSPIRENONE-ETHINYL ESTRADIOL 3-0.02 MG PO TABS: 1.0000 | ORAL_TABLET | Freq: Every day | ORAL | 4 refills | Status: DC

## 2019-10-03 MED ORDER — TERCONAZOLE 0.4 % VA CREA: 1.0000 | TOPICAL_CREAM | Freq: Every day | VAGINAL | 0 refills | Status: DC

## 2019-10-03 NOTE — Patient Instructions (Signed)
Health Maintenance, Female Adopting a healthy lifestyle and getting preventive care are important in promoting health and wellness. Ask your health care provider about:  The right schedule for you to have regular tests and exams.  Things you can do on your own to prevent diseases and keep yourself healthy. What should I know about diet, weight, and exercise? Eat a healthy diet   Eat a diet that includes plenty of vegetables, fruits, low-fat dairy products, and lean protein.  Do not eat a lot of foods that are high in solid fats, added sugars, or sodium. Maintain a healthy weight Body mass index (BMI) is used to identify weight problems. It estimates body fat based on height and weight. Your health care provider can help determine your BMI and help you achieve or maintain a healthy weight. Get regular exercise Get regular exercise. This is one of the most important things you can do for your health. Most adults should:  Exercise for at least 150 minutes each week. The exercise should increase your heart rate and make you sweat (moderate-intensity exercise).  Do strengthening exercises at least twice a week. This is in addition to the moderate-intensity exercise.  Spend less time sitting. Even light physical activity can be beneficial. Watch cholesterol and blood lipids Have your blood tested for lipids and cholesterol at 35 years of age, then have this test every 5 years. Have your cholesterol levels checked more often if:  Your lipid or cholesterol levels are high.  You are older than 35 years of age.  You are at high risk for heart disease. What should I know about cancer screening? Depending on your health history and family history, you may need to have cancer screening at various ages. This may include screening for:  Breast cancer.  Cervical cancer.  Colorectal cancer.  Skin cancer.  Lung cancer. What should I know about heart disease, diabetes, and high blood  pressure? Blood pressure and heart disease  High blood pressure causes heart disease and increases the risk of stroke. This is more likely to develop in people who have high blood pressure readings, are of African descent, or are overweight.  Have your blood pressure checked: ? Every 3-5 years if you are 18-39 years of age. ? Every year if you are 40 years old or older. Diabetes Have regular diabetes screenings. This checks your fasting blood sugar level. Have the screening done:  Once every three years after age 40 if you are at a normal weight and have a low risk for diabetes.  More often and at a younger age if you are overweight or have a high risk for diabetes. What should I know about preventing infection? Hepatitis B If you have a higher risk for hepatitis B, you should be screened for this virus. Talk with your health care provider to find out if you are at risk for hepatitis B infection. Hepatitis C Testing is recommended for:  Everyone born from 1945 through 1965.  Anyone with known risk factors for hepatitis C. Sexually transmitted infections (STIs)  Get screened for STIs, including gonorrhea and chlamydia, if: ? You are sexually active and are younger than 35 years of age. ? You are older than 35 years of age and your health care provider tells you that you are at risk for this type of infection. ? Your sexual activity has changed since you were last screened, and you are at increased risk for chlamydia or gonorrhea. Ask your health care provider if   you are at risk.  Ask your health care provider about whether you are at high risk for HIV. Your health care provider may recommend a prescription medicine to help prevent HIV infection. If you choose to take medicine to prevent HIV, you should first get tested for HIV. You should then be tested every 3 months for as long as you are taking the medicine. Pregnancy  If you are about to stop having your period (premenopausal) and  you may become pregnant, seek counseling before you get pregnant.  Take 400 to 800 micrograms (mcg) of folic acid every day if you become pregnant.  Ask for birth control (contraception) if you want to prevent pregnancy. Osteoporosis and menopause Osteoporosis is a disease in which the bones lose minerals and strength with aging. This can result in bone fractures. If you are 65 years old or older, or if you are at risk for osteoporosis and fractures, ask your health care provider if you should:  Be screened for bone loss.  Take a calcium or vitamin D supplement to lower your risk of fractures.  Be given hormone replacement therapy (HRT) to treat symptoms of menopause. Follow these instructions at home: Lifestyle  Do not use any products that contain nicotine or tobacco, such as cigarettes, e-cigarettes, and chewing tobacco. If you need help quitting, ask your health care provider.  Do not use street drugs.  Do not share needles.  Ask your health care provider for help if you need support or information about quitting drugs. Alcohol use  Do not drink alcohol if: ? Your health care provider tells you not to drink. ? You are pregnant, may be pregnant, or are planning to become pregnant.  If you drink alcohol: ? Limit how much you use to 0-1 drink a day. ? Limit intake if you are breastfeeding.  Be aware of how much alcohol is in your drink. In the [place] one drink equals one 12 oz bottle of beer (355 mL), one 5 oz glass of wine (148 mL), or one 1 oz glass of hard liquor (44 mL). General instructions  Schedule regular health, dental, and eye exams.  Stay current with your vaccines.  Tell your health care provider if: ? You often feel depressed. ? You have ever been abused or do not feel safe at home. Summary  Adopting a healthy lifestyle and getting preventive care are important in promoting health and wellness.  Follow your health care provider's instructions about healthy  diet, exercising, and getting tested or screened for diseases.  Follow your health care provider's instructions on monitoring your cholesterol and blood pressure. This information is not intended to replace advice given to you by your health care provider. Make sure you discuss any questions you have with your health care provider. Document Released: 05/25/2011 Document Revised: 11/02/2018 Document Reviewed: 11/02/2018 Elsevier Patient Education  2020 Elsevier Inc.  

## 2019-10-03 NOTE — Progress Notes (Signed)
Patricia Hogan 09-May-1986 [id number]    History:    Presents for annual exam.  Monthly cycle on Yaz, one episode of irregular bleeding last month.   Not sexually active.  2012 LGSIL with normal Paps after.  Anxiety and depression managed by psychiatrist.  Labs primary care.  Gardasil series completed.  Past medical history, past surgical history, family history and social history were all reviewed and documented in the EPIC chart.  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  This past year bought her grandfather's farm house and his redone the entire interior with the help of her parents.  Very involved with 53-year-old niece.  ROS:  A ROS was performed and pertinent positives and negatives are included.  Exam:  Vitals:   10/03/19 1204  BP: 118/80  Weight: 171 lb (77.6 kg)  Height: 5\' 4"  (1.626 m)   Body mass index is 29.35 kg/m.   General appearance:  Normal Thyroid:  Symmetrical, normal in size, without palpable masses or nodularity. Respiratory  Auscultation:  Clear without wheezing or rhonchi Cardiovascular  Auscultation:  Regular rate, without rubs, murmurs or gallops  Edema/varicosities:  Not grossly evident Abdominal  Soft,nontender, without masses, guarding or rebound.  Liver/spleen:  No organomegaly noted  Hernia:  None appreciated  Skin  Inspection:  Grossly normal   Breasts: Examined lying and sitting. Bilateral implants    Right: Without masses, retractions, discharge or axillary adenopathy.     Left: Without masses, retractions, discharge or axillary adenopathy. Gentitourinary   Inguinal/mons:  Normal without inguinal adenopathy  External genitalia:  Normal  BUS/Urethra/Skene's glands:  Normal  Vagina:  Normal  Cervix:  Normal  Uterus:   normal in size, shape and contour.  Midline and mobile  Adnexa/parametria:     Rt: Without masses or tenderness.   Lt: Without masses or tenderness.  Anus and  perineum: Normal  Digital rectal exam: Normal sphincter tone without palpated masses or tenderness  Assessment/Plan:  33 y.o. S W[place] G0 for annual exam with complaint of occasional external vaginal itching.  Monthly cycle on Yaz one episode of irregular bleeding Vaginal itching 2012 LGSIL normal Paps after  Plan: Yaz prescription, proper use, slight risk for blood clots and strokes reviewed.  Reviewed importance of returning if irregular bleeding.  Denies any missed pills.  Terazol 7 prescription, proper use given and reviewed to apply externally as needed.  Reviewed wet prep was negative and exam appears normal.  SBEs, annual screening mammogram at 38, exercise, calcium rich foods, MVI daily encouraged.  Congratulated on 25 pound weight loss with weight watchers.  Pap normal 2019, new screening guidelines reviewed.    Huel [facility], 12:11 PM 10/03/2019

## 2019-12-04 ENCOUNTER — Ambulatory Visit
Admission: EM | Admit: 2019-12-04 | Discharge: 2019-12-04 | Disposition: A | Discharge status: Home / Self Care | Payer: BC Managed Care – PPO | Attending: Emergency Medicine | Admitting: Emergency Medicine

## 2019-12-04 ENCOUNTER — Encounter: Payer: Self-pay | Admitting: Emergency Medicine

## 2019-12-04 ENCOUNTER — Other Ambulatory Visit: Payer: Self-pay

## 2019-12-04 DIAGNOSIS — R05 Cough: Secondary | ICD-10-CM | POA: Diagnosis not present

## 2019-12-04 DIAGNOSIS — Z20822 Contact with and (suspected) exposure to covid-19: Secondary | ICD-10-CM | POA: Diagnosis not present

## 2019-12-04 DIAGNOSIS — J069 Acute upper respiratory infection, unspecified: Secondary | ICD-10-CM | POA: Diagnosis not present

## 2019-12-04 DIAGNOSIS — R059 Cough, unspecified: Secondary | ICD-10-CM

## 2019-12-04 LAB — POC SARS CORONAVIRUS 2 AG -  ED: SARS Coronavirus 2 Ag: NEGATIVE

## 2019-12-04 MED ORDER — PREDNISONE 20 MG PO TABS: 20.0000 mg | ORAL_TABLET | Freq: Every day | ORAL | 0 refills | Status: DC

## 2019-12-04 MED ORDER — BENZONATATE 100 MG PO CAPS: 100.0000 mg | ORAL_CAPSULE | Freq: Three times a day (TID) | ORAL | 0 refills | Status: DC

## 2019-12-04 MED ORDER — AEROCHAMBER PLUS FLO-VU MEDIUM MISC: 1.0000 | Freq: Once | 0 refills | Status: AC

## 2019-12-04 MED ORDER — ALBUTEROL SULFATE HFA 108 (90 BASE) MCG/ACT IN AERS: 2.0000 | INHALATION_SPRAY | RESPIRATORY_TRACT | 0 refills | Status: DC | PRN

## 2019-12-04 NOTE — ED Triage Notes (Addendum)
Pt presents to [facility] for assessment of 3 days of nasal congestion, cough, sore throat, headaches.  Denies n/v/d.  Pt states she is in healthcare and is requesting the rapid test, states she wants to wait on the PCR until speaking to provider

## 2019-12-04 NOTE — ED Provider Notes (Signed)
[facility]    CSN: [id number] Arrival date & time: 12/04/19  6144      History   Chief Complaint Chief Complaint  Patient presents with  . URI    HPI Patricia Hogan is a 34 y.o. female presenting for the requested nasal congestion, dry cough, sore throat, headaches.  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  Patient does use OTC sinus/cold medications with mild relief of symptoms.  Patient requesting rapid Covid test as she is supposed to be at work tomorrow.   Past Medical History:  Diagnosis Date  . Anxiety   . Broken tibia    11/2015  . Depression   . Family history of adverse reaction to anesthesia    pt's father has hx. of post-op N/V  . Mass of right wrist 05/2015   volar mass  . PONV (postoperative nausea and vomiting)     Patient Active Problem List   Diagnosis Date Noted  . LG SIL/CIN-1 on colposcopy with biopsy 02/2011   . Depression   . Anxiety   . Acne     Past Surgical History:  Procedure Laterality Date  . ANKLE SURGERY  07/30/2016  . BREAST ENHANCEMENT SURGERY Bilateral 2004  . BREAST SURGERY  2013   revision breast augmentation due to one ruptured implant  . MASS EXCISION Right 06/14/2015   Procedure: RIGHT VOLAR WRIST MASS EXCISION WITH ARTHROTOMY AND SYNOVECTOMY AS NECESSARY;  Surgeon: Dominica Severin, MD;  Location: [facility];  Service: Orthopedics;  Laterality: Right;  Dr. refused to send specimen preferring to send with patient.  . WISDOM TOOTH EXTRACTION      OB History    Gravida  0   Para      Term      Preterm      AB      Living        SAB      TAB      Ectopic      Multiple      Live Births               Home Medications    Prior to Admission medications   Medication Sig Start Date End Date Taking? Authorizing Provider  acyclovir (ZOVIRAX) 200 MG capsule Take 1 capsule (200 mg total) by mouth 5 (five) times daily. 11/12/17   Harrington Challenger, NP  albuterol  (VENTOLIN HFA) 108 (90 Base) MCG/ACT inhaler Inhale 2 puffs into the lungs every 4 (four) hours as needed for wheezing or shortness of breath. 12/04/19   Hall-Potvin, [place], PA-C  ALPRAZolam (XANAX) 0.25 MG tablet Take 0.25 mg by mouth as needed for anxiety.    [provider]  benzonatate (TESSALON) 100 MG capsule Take 1 capsule (100 mg total) by mouth every 8 (eight) hours. 12/04/19   Hall-Potvin, [place], PA-C  buPROPion (WELLBUTRIN XL) 300 MG 24 hr tablet Take 300 mg by mouth daily.    [provider]  drospirenone-ethinyl estradiol (YAZ) 3-0.02 MG tablet Take 1 tablet by mouth daily. 10/03/19   Harrington Challenger, NP  imiquimod (ALDARA) 5 % cream Apply topically 3 (three) times a week. 09/01/17   Harrington Challenger, NP  predniSONE (DELTASONE) 20 MG tablet Take 1 tablet (20 mg total) by mouth daily. 12/04/19   Hall-Potvin, [place], PA-C  Spacer/Aero-Holding Chambers (AEROCHAMBER PLUS FLO-VU MEDIUM) MISC 1 each by Other route once for 1 dose. 12/04/19 12/04/19  Hall-Potvin, [place], PA-C  terconazole (  TERAZOL 7) 0.4 % vaginal cream Place 1 applicator vaginally at bedtime. 10/03/19   Harrington Challenger, NP  traZODone (DESYREL) 100 MG tablet Take 100 mg by mouth at bedtime.    [provider]  venlafaxine (EFFEXOR) 75 MG tablet Take 75 mg by mouth daily.     [provider]    Family History Family History  Problem Relation Age of Onset  . Diabetes Mellitus I Brother   . Hypertension Maternal Grandmother   . Diabetes Maternal Grandfather   . Breast cancer Paternal Grandmother 33  . Anesthesia problems Father        post-op N/V    Social History Social History   Tobacco Use  . Smoking status: Never Smoker  . Smokeless tobacco: Never Used  Substance Use Topics  . Alcohol use: No  . Drug use: No     Allergies   Benzoyl peroxide and Sulfa antibiotics   Review of Systems Review of Systems  Constitutional: Positive for fatigue. Negative for activity  change, appetite change and fever.  HENT: Positive for congestion, postnasal drip, rhinorrhea and sore throat. Negative for dental problem, ear pain, facial swelling, hearing loss, sinus pain, trouble swallowing and voice change.   Eyes: Negative for photophobia, pain, redness and visual disturbance.  Respiratory: Positive for cough. Negative for shortness of breath, wheezing and stridor.   Cardiovascular: Negative for chest pain and palpitations.  Gastrointestinal: Negative for abdominal pain, blood in stool, diarrhea, nausea and vomiting.  Musculoskeletal: Negative for arthralgias, myalgias, neck pain and neck stiffness.  Skin: Negative for rash and wound.  Neurological: Negative for dizziness, syncope and headaches.     Physical Exam Triage Vital Signs ED Triage Vitals  Enc Vitals Group     BP      Pulse      Resp      Temp      Temp src      SpO2      Weight      Height      Head Circumference      Peak Flow      Pain Score      Pain Loc      Pain Edu?      Excl. in GC?    No data found.  Updated Vital Signs BP 117/78 (BP Location: Left Arm)   Pulse 70   Temp (!) 97.5 F (36.4 C) (Temporal)   Resp 16   SpO2 97%   Visual Acuity Right Eye Distance:   Left Eye Distance:   Bilateral Distance:    Right Eye Near:   Left Eye Near:    Bilateral Near:     Physical Exam Constitutional:      General: She is not in acute distress.    Appearance: She is not ill-appearing.  HENT:     Head: Normocephalic and atraumatic.     Jaw: There is normal jaw occlusion. No tenderness or pain on movement.     Right Ear: Hearing, tympanic membrane, ear canal and external ear normal. No tenderness. No mastoid tenderness.     Left Ear: Hearing, tympanic membrane, ear canal and external ear normal. No tenderness. No mastoid tenderness.     Nose: Nose normal. No nasal deformity, septal deviation or nasal tenderness.     Right Turbinates: Not swollen or pale.     Left Turbinates: Not  swollen or pale.     Right Sinus: No maxillary sinus tenderness or frontal sinus tenderness.  Left Sinus: No maxillary sinus tenderness or frontal sinus tenderness.     Comments: Negative sinus tenderness    Mouth/Throat:     Lips: Pink. No lesions.     Mouth: Mucous membranes are moist. No injury.     Pharynx: Oropharynx is clear. Uvula midline. No posterior oropharyngeal erythema or uvula swelling.     Comments: no tonsillar exudate or hypertrophy Eyes:     General: No scleral icterus.    Conjunctiva/sclera: Conjunctivae normal.     Pupils: Pupils are equal, round, and reactive to light.  Cardiovascular:     Rate and Rhythm: Normal rate and regular rhythm.     Heart sounds: No murmur. No gallop.   Pulmonary:     Effort: Pulmonary effort is normal. No respiratory distress.     Breath sounds: No stridor. No wheezing.     Comments: Decreased breath sounds bilaterally Musculoskeletal:     Cervical back: Normal range of motion and neck supple. No tenderness. No muscular tenderness.  Lymphadenopathy:     Cervical: No cervical adenopathy.  Skin:    General: Skin is warm.     Capillary Refill: Capillary refill takes less than 2 seconds.     Coloration: Skin is not jaundiced.     Findings: No rash.  Neurological:     General: No focal deficit present.     Mental Status: She is alert and oriented to person, place, and time.      UC Treatments / Results  Labs (all labs ordered are listed, but only abnormal results are displayed) Labs Reviewed  POC SARS CORONAVIRUS 2 AG -  ED - Normal  NOVEL CORONAVIRUS, NAA    EKG   Radiology No results found.  Procedures Procedures (including critical care time)  Medications Ordered in UC Medications - No data to display  Initial Impression / Assessment and Plan / UC Course  I have reviewed the triage vital signs and the nursing notes.  Pertinent labs & imaging results that were available during my care of the patient were  reviewed by me and considered in my medical decision making (see chart for details).    Patient afebrile, nontoxic, with SpO2 97%.  Rapid Covid in office, reviewed by me: negative - PCR pending.  Patient to quarantine until results are back.  We will continue supportive management, add benzonatate, prednisone and albuterol with spacing device for cough.  Return precautions discussed, patient verbalized understanding and is agreeable to plan. Final Clinical Impressions(s) / UC Diagnoses   Final diagnoses:  Cough  Viral URI with cough     Discharge Instructions     Tessalon for cough. Start flonase, atrovent nasal spray for nasal congestion/drainage. You can use over the counter nasal saline rinse such as neti pot for nasal congestion. Keep hydrated, your urine should be clear to pale yellow in color. Tylenol/motrin for fever and pain. Monitor for any worsening of symptoms, chest pain, shortness of breath, wheezing, swelling of the throat, go to the emergency department for further evaluation needed.     ED Prescriptions    Medication Sig Dispense Auth. Provider   predniSONE (DELTASONE) 20 MG tablet Take 1 tablet (20 mg total) by mouth daily. 7 tablet Hall-Potvin, [place], PA-C   benzonatate (TESSALON) 100 MG capsule Take 1 capsule (100 mg total) by mouth every 8 (eight) hours. 21 capsule Hall-Potvin, [place], PA-C   albuterol (VENTOLIN HFA) 108 (90 Base) MCG/ACT inhaler Inhale 2 puffs into the lungs every 4 (four) hours  as needed for wheezing or shortness of breath. 18 g Hall-Potvin, [place], PA-C   Spacer/Aero-Holding Chambers (AEROCHAMBER PLUS FLO-VU MEDIUM) MISC 1 each by Other route once for 1 dose. 1 each Hall-Potvin, [place], PA-C     PDMP not reviewed this encounter.   Hall-Potvin, [place], [place] 12/04/19 1842

## 2019-12-04 NOTE — Discharge Instructions (Signed)

## 2019-12-05 LAB — NOVEL CORONAVIRUS, NAA: SARS-CoV-2, NAA: NOT DETECTED

## 2019-12-15 DIAGNOSIS — F429 Obsessive-compulsive disorder, unspecified: Secondary | ICD-10-CM | POA: Diagnosis not present

## 2020-01-19 DIAGNOSIS — F429 Obsessive-compulsive disorder, unspecified: Secondary | ICD-10-CM | POA: Diagnosis not present

## 2020-02-16 DIAGNOSIS — F429 Obsessive-compulsive disorder, unspecified: Secondary | ICD-10-CM | POA: Diagnosis not present

## 2020-04-15 ENCOUNTER — Other Ambulatory Visit: Payer: Self-pay | Admitting: *Deleted

## 2020-04-15 DIAGNOSIS — Z3041 Encounter for surveillance of contraceptive pills: Secondary | ICD-10-CM

## 2020-04-15 MED ORDER — DROSPIRENONE-ETHINYL ESTRADIOL 3-0.02 MG PO TABS: 1.0000 | ORAL_TABLET | Freq: Every day | ORAL | 1 refills | Status: DC

## 2020-04-26 DIAGNOSIS — F429 Obsessive-compulsive disorder, unspecified: Secondary | ICD-10-CM | POA: Diagnosis not present

## 2020-05-17 ENCOUNTER — Ambulatory Visit
Admission: EM | Admit: 2020-05-17 | Discharge: 2020-05-17 | Disposition: A | Discharge status: Home / Self Care | Payer: BC Managed Care – PPO | Attending: Physician Assistant | Admitting: Physician Assistant

## 2020-05-17 DIAGNOSIS — R0981 Nasal congestion: Secondary | ICD-10-CM

## 2020-05-17 DIAGNOSIS — R05 Cough: Secondary | ICD-10-CM | POA: Diagnosis not present

## 2020-05-17 DIAGNOSIS — R059 Cough, unspecified: Secondary | ICD-10-CM

## 2020-05-17 DIAGNOSIS — J3489 Other specified disorders of nose and nasal sinuses: Secondary | ICD-10-CM | POA: Diagnosis not present

## 2020-05-17 MED ORDER — BENZONATATE 200 MG PO CAPS
200.0000 mg | ORAL_CAPSULE | Freq: Three times a day (TID) | ORAL | 0 refills | Status: DC
Start: 2020-05-17 — End: 2020-10-16

## 2020-05-17 MED ORDER — FLUTICASONE PROPIONATE 50 MCG/ACT NA SUSP
2.0000 | Freq: Every day | NASAL | 0 refills | Status: DC
Start: 2020-05-17 — End: 2020-10-16

## 2020-05-17 NOTE — Discharge Instructions (Signed)
COVID PCR testing ordered. I would like you to quarantine until testing results. Tessalon for cough. Start flonase nasal spray for nasal congestion/drainage. You can use over the counter nasal saline rinse such as neti pot for nasal congestion. Keep hydrated, your urine should be clear to pale yellow in color. Tylenol/motrin for fever and pain. Monitor for any worsening of symptoms, chest pain, shortness of breath, wheezing, swelling of the throat, go to the emergency department for further evaluation needed.  ° ° °

## 2020-05-17 NOTE — ED Provider Notes (Signed)
[facility]    CSN: [id number] Arrival date & time: 05/17/20  0846      History   Chief Complaint Chief Complaint  Patient presents with  . Cough    HPI Patricia Hogan is a 34 y.o. female.   34 year old female comes in for 3 day of URI symptoms. Cough, nasal congestion, sore throat, headache. Denies fever, chills, body aches. Denies abdominal pain, nausea, vomiting, diarrhea. Denies shortness of breath, loss of taste/smell. Never smoker.     Past Medical History:  Diagnosis Date  . Anxiety   . Broken tibia    11/2015  . Depression   . Family history of adverse reaction to anesthesia    pt's father has hx. of post-op N/V  . Mass of right wrist 05/2015   volar mass  . PONV (postoperative nausea and vomiting)     Patient Active Problem List   Diagnosis Date Noted  . LG SIL/CIN-1 on colposcopy with biopsy 02/2011   . Depression   . Anxiety   . Acne     Past Surgical History:  Procedure Laterality Date  . ANKLE SURGERY  07/30/2016  . BREAST ENHANCEMENT SURGERY Bilateral 2004  . BREAST SURGERY  2013   revision breast augmentation due to one ruptured implant  . MASS EXCISION Right 06/14/2015   Procedure: RIGHT VOLAR WRIST MASS EXCISION WITH ARTHROTOMY AND SYNOVECTOMY AS NECESSARY;  Surgeon: Roseanne Kaufman, MD;  Location: [facility];  Service: Orthopedics;  Laterality: Right;  Dr. refused to send specimen preferring to send with patient.  . WISDOM TOOTH EXTRACTION      OB History    Gravida  0   Para      Term      Preterm      AB      Living        SAB      TAB      Ectopic      Multiple      Live Births               Home Medications    Prior to Admission medications   Medication Sig Start Date End Date Taking? Authorizing Provider  acyclovir (ZOVIRAX) 200 MG capsule Take 1 capsule (200 mg total) by mouth 5 (five) times daily. 11/12/17   Huel Cote, NP  ALPRAZolam Duanne Moron) 0.25 MG tablet Take 0.25 mg by  mouth as needed for anxiety.    [provider]  benzonatate (TESSALON) 200 MG capsule Take 1 capsule (200 mg total) by mouth every 8 (eight) hours. 05/17/20   Tasia Catchings, Pilar Westergaard V, PA-C  buPROPion (WELLBUTRIN XL) 300 MG 24 hr tablet Take 300 mg by mouth daily.    [provider]  drospirenone-ethinyl estradiol (YAZ) 3-0.02 MG tablet Take 1 tablet by mouth daily. 04/15/20   Tamela Gammon, NP  fluticasone (FLONASE) 50 MCG/ACT nasal spray Place 2 sprays into both nostrils daily. 05/17/20   Tasia Catchings, Everlean Bucher V, PA-C  imiquimod (ALDARA) 5 % cream Apply topically 3 (three) times a week. 09/01/17   Huel Cote, NP  terconazole (TERAZOL 7) 0.4 % vaginal cream Place 1 applicator vaginally at bedtime. 10/03/19   Huel Cote, NP  traZODone (DESYREL) 100 MG tablet Take 100 mg by mouth at bedtime.    [provider]  venlafaxine (EFFEXOR) 75 MG tablet Take 75 mg by mouth daily.     [provider]    [facility]  History Family History  Problem Relation Age of Onset  . Diabetes Mellitus I Brother   . Hypertension Maternal Grandmother   . Diabetes Maternal Grandfather   . Breast cancer Paternal Grandmother 56  . Anesthesia problems Father        post-op N/V    Social History Social History   Tobacco Use  . Smoking status: Never Smoker  . Smokeless tobacco: Never Used  Vaping Use  . Vaping Use: Never used  Substance Use Topics  . Alcohol use: No  . Drug use: No     Allergies   Benzoyl peroxide and Sulfa antibiotics   Review of Systems Review of Systems  Reason unable to perform ROS: See HPI as above.     Physical Exam Triage Vital Signs ED Triage Vitals  Enc Vitals Group     BP 05/17/20 0919 101/69     Pulse Rate 05/17/20 0919 82     Resp 05/17/20 0919 18     Temp 05/17/20 0919 98.3 F (36.8 C)     Temp Source 05/17/20 0919 Oral     SpO2 05/17/20 0919 97 %     Weight --      Height --      Head Circumference --      Peak Flow --      Pain Score 05/17/20  0933 0     Pain Loc --      Pain Edu? --      Excl. in GC? --    No data found.  Updated Vital Signs BP 101/69 (BP Location: Left Arm)   Pulse 82   Temp 98.3 F (36.8 C) (Oral)   Resp 18   LMP 04/21/2020   SpO2 97%   Visual Acuity Right Eye Distance:   Left Eye Distance:   Bilateral Distance:    Right Eye Near:   Left Eye Near:    Bilateral Near:     Physical Exam Constitutional:      General: She is not in acute distress.    Appearance: Normal appearance. She is well-developed. She is not ill-appearing, toxic-appearing or diaphoretic.  HENT:     Head: Normocephalic and atraumatic.     Right Ear: Tympanic membrane, ear canal and external ear normal. Tympanic membrane is not erythematous or bulging.     Left Ear: Tympanic membrane, ear canal and external ear normal. Tympanic membrane is not erythematous or bulging.     Nose:     Right Sinus: Maxillary sinus tenderness and frontal sinus tenderness present.     Left Sinus: Maxillary sinus tenderness and frontal sinus tenderness present.     Mouth/Throat:     Mouth: Mucous membranes are moist.     Pharynx: Oropharynx is clear. Uvula midline.  Eyes:     Conjunctiva/sclera: Conjunctivae normal.     Pupils: Pupils are equal, round, and reactive to light.  Cardiovascular:     Rate and Rhythm: Normal rate and regular rhythm.  Pulmonary:     Effort: Pulmonary effort is normal. No accessory muscle usage, prolonged expiration, respiratory distress or retractions.     Breath sounds: No decreased air movement or transmitted upper airway sounds. No decreased breath sounds.     Comments: LCTAB Musculoskeletal:     Cervical back: Normal range of motion and neck supple.  Skin:    General: Skin is warm and dry.  Neurological:     Mental Status: She is alert and oriented to person, place, and time.  UC Treatments / Results  Labs (all labs ordered are listed, but only abnormal results are displayed) Labs Reviewed  NOVEL  CORONAVIRUS, NAA    EKG   Radiology No results found.  Procedures Procedures (including critical care time)  Medications Ordered in UC Medications - No data to display  Initial Impression / Assessment and Plan / UC Course  I have reviewed the triage vital signs and the nursing notes.  Pertinent labs & imaging results that were available during my care of the patient were reviewed by me and considered in my medical decision making (see chart for details).    COVID PCR test ordered. Patient to quarantine until testing results return. No alarming signs on exam.  Patient speaking in full sentences without respiratory distress.  Symptomatic treatment discussed.  Push fluids.  Return precautions given.  Patient expresses understanding and agrees to plan.  Final Clinical Impressions(s) / UC Diagnoses   Final diagnoses:  Cough  Nasal congestion  Sinus pressure   ED Prescriptions    Medication Sig Dispense Auth. Provider   benzonatate (TESSALON) 200 MG capsule Take 1 capsule (200 mg total) by mouth every 8 (eight) hours. 21 capsule Ambrielle Kington V, PA-C   fluticasone (FLONASE) 50 MCG/ACT nasal spray Place 2 sprays into both nostrils daily. 1 g Belinda Fisher, PA-C     PDMP not reviewed this encounter.   Belinda Fisher, PA-C 05/17/20 1002

## 2020-05-17 NOTE — ED Triage Notes (Signed)
Pt c/o cough, nasal congestion, sore throat, and headache since Tuesday.

## 2020-05-18 LAB — NOVEL CORONAVIRUS, NAA: SARS-CoV-2, NAA: NOT DETECTED

## 2020-05-18 LAB — SARS-COV-2, NAA 2 DAY TAT

## 2020-05-19 DIAGNOSIS — J019 Acute sinusitis, unspecified: Secondary | ICD-10-CM | POA: Diagnosis not present

## 2020-05-19 DIAGNOSIS — J309 Allergic rhinitis, unspecified: Secondary | ICD-10-CM | POA: Diagnosis not present

## 2020-08-08 DIAGNOSIS — F429 Obsessive-compulsive disorder, unspecified: Secondary | ICD-10-CM | POA: Diagnosis not present

## 2020-10-04 DIAGNOSIS — F429 Obsessive-compulsive disorder, unspecified: Secondary | ICD-10-CM | POA: Diagnosis not present

## 2020-10-07 ENCOUNTER — Other Ambulatory Visit: Payer: Self-pay | Admitting: Nurse Practitioner

## 2020-10-07 DIAGNOSIS — Z3041 Encounter for surveillance of contraceptive pills: Secondary | ICD-10-CM

## 2020-10-07 NOTE — Telephone Encounter (Signed)
Patient has CE scheduled 10/16/20 with TW.

## 2020-10-11 DIAGNOSIS — Z0000 Encounter for general adult medical examination without abnormal findings: Secondary | ICD-10-CM | POA: Diagnosis not present

## 2020-10-11 DIAGNOSIS — R5383 Other fatigue: Secondary | ICD-10-CM | POA: Diagnosis not present

## 2020-10-11 DIAGNOSIS — E611 Iron deficiency: Secondary | ICD-10-CM | POA: Diagnosis not present

## 2020-10-16 ENCOUNTER — Encounter: Payer: Self-pay | Admitting: Nurse Practitioner

## 2020-10-16 ENCOUNTER — Other Ambulatory Visit: Payer: Self-pay

## 2020-10-16 ENCOUNTER — Ambulatory Visit (INDEPENDENT_AMBULATORY_CARE_PROVIDER_SITE_OTHER): Payer: BC Managed Care – PPO | Admitting: Nurse Practitioner

## 2020-10-16 VITALS — BP 118/76 | Ht 64.0 in | Wt 180.0 lb

## 2020-10-16 DIAGNOSIS — N920 Excessive and frequent menstruation with regular cycle: Secondary | ICD-10-CM

## 2020-10-16 DIAGNOSIS — Z3041 Encounter for surveillance of contraceptive pills: Secondary | ICD-10-CM | POA: Diagnosis not present

## 2020-10-16 DIAGNOSIS — Z01419 Encounter for gynecological examination (general) (routine) without abnormal findings: Secondary | ICD-10-CM | POA: Diagnosis not present

## 2020-10-16 MED ORDER — DROSPIRENONE-ETHINYL ESTRADIOL 3-0.02 MG PO TABS: 1.0000 | ORAL_TABLET | Freq: Every day | ORAL | 3 refills | Status: DC

## 2020-10-16 NOTE — Progress Notes (Signed)
   Patricia Hogan [birth year] [id number]   History:  34 y.o. G0 presents for annual exam. Monthly cycle on OCPs. Report heavier cycles the last couple of months with heavier bleeding the first 3 days.  2012 LGSIL, subsequent paps normal.   Gynecologic History Patient's last menstrual period was 10/10/2020. Period Cycle (Days): 28 Period Duration (Days): 3 Period Pattern: Regular Menstrual Flow: Heavy Dysmenorrhea: (!) Moderate Dysmenorrhea Symptoms: Cramping Contraception: OCP (estrogen/progesterone) Last Pap: 09/27/2018. Results were: normal  Past medical history, past surgical history, family history and social history were all reviewed and documented in the EPIC chart.  ROS:  A ROS was performed and pertinent positives and negatives are included.  Exam:  Vitals:   10/16/20 1409  BP: 118/76  Weight: 180 lb (81.6 kg)  Height: 5\' 4"  (1.626 m)   Body mass index is 30.9 kg/m.  General appearance:  Normal Thyroid:  Symmetrical, normal in size, without palpable masses or nodularity. Respiratory  Auscultation:  Clear without wheezing or rhonchi Cardiovascular  Auscultation:  Regular rate, without rubs, murmurs or gallops  Edema/varicosities:  Not grossly evident Abdominal  Soft,nontender, without masses, guarding or rebound.  Liver/spleen:  No organomegaly noted  Hernia:  None appreciated  Skin  Inspection:  Grossly normal   Breasts: Examined lying and sitting. Bilateral breast implants  Right: Without masses, retractions, discharge or axillary adenopathy.   Left: Without masses, retractions, discharge or axillary adenopathy. Gentitourinary   Inguinal/mons:  Normal without inguinal adenopathy  External genitalia:  Normal  BUS/Urethra/Skene's glands:  Normal  Vagina:  Normal  Cervix:  Normal  Uterus:  Normal in size, shape and contour.  Midline and mobile  Adnexa/parametria:     Rt: Without masses or tenderness.   Lt: Without masses or tenderness.  Anus and  perineum: Normal  Assessment/Plan:  34 y.o. G0 for annual exam.   Well female exam with routine gynecological exam - Education provided on SBEs, importance of preventative screenings, current guidelines, high calcium diet, regular exercise, and multivitamin daily. Labs with PCP.   Encounter for surveillance of contraceptive pills - Plan: drospirenone-ethinyl estradiol (YAZ) 3-0.02 MG tablet daily. Refill x 1 year provided.   Menorrhagia with regular cycle - she will monitor cycles for a couple of months. If heavy bleeding and dysmenorrhea continue we will increase OCP hormones for better management. She has history of menorrhagia and has been well controlled with OCPs for years. She is agreeable to plan.   Screening for cervical cancer - 2012 biopsy confirmed LGSIL, subsequent paps normal. Will repeat at 3-year interval.  Follow up in 1 year for annual.     2013 [facility], 2:19 PM 10/16/2020

## 2020-10-16 NOTE — Patient Instructions (Signed)
Health Maintenance, Female Adopting a healthy lifestyle and getting preventive care are important in promoting health and wellness. Ask your health care provider about:  The right schedule for you to have regular tests and exams.  Things you can do on your own to prevent diseases and keep yourself healthy. What should I know about diet, weight, and exercise? Eat a healthy diet   Eat a diet that includes plenty of vegetables, fruits, low-fat dairy products, and lean protein.  Do not eat a lot of foods that are high in solid fats, added sugars, or sodium. Maintain a healthy weight Body mass index (BMI) is used to identify weight problems. It estimates body fat based on height and weight. Your health care provider can help determine your BMI and help you achieve or maintain a healthy weight. Get regular exercise Get regular exercise. This is one of the most important things you can do for your health. Most adults should:  Exercise for at least 150 minutes each week. The exercise should increase your heart rate and make you sweat (moderate-intensity exercise).  Do strengthening exercises at least twice a week. This is in addition to the moderate-intensity exercise.  Spend less time sitting. Even light physical activity can be beneficial. Watch cholesterol and blood lipids Have your blood tested for lipids and cholesterol at 36 years of age, then have this test every 5 years. Have your cholesterol levels checked more often if:  Your lipid or cholesterol levels are high.  You are older than 36 years of age.  You are at high risk for heart disease. What should I know about cancer screening? Depending on your health history and family history, you may need to have cancer screening at various ages. This may include screening for:  Breast cancer.  Cervical cancer.  Colorectal cancer.  Skin cancer.  Lung cancer. What should I know about heart disease, diabetes, and high blood  pressure? Blood pressure and heart disease  High blood pressure causes heart disease and increases the risk of stroke. This is more likely to develop in people who have high blood pressure readings, are of African descent, or are overweight.  Have your blood pressure checked: ? Every 3-5 years if you are 18-39 years of age. ? Every year if you are 40 years old or older. Diabetes Have regular diabetes screenings. This checks your fasting blood sugar level. Have the screening done:  Once every three years after age 40 if you are at a normal weight and have a low risk for diabetes.  More often and at a younger age if you are overweight or have a high risk for diabetes. What should I know about preventing infection? Hepatitis B If you have a higher risk for hepatitis B, you should be screened for this virus. Talk with your health care provider to find out if you are at risk for hepatitis B infection. Hepatitis C Testing is recommended for:  Everyone born from 1945 through 1965.  Anyone with known risk factors for hepatitis C. Sexually transmitted infections (STIs)  Get screened for STIs, including gonorrhea and chlamydia, if: ? You are sexually active and are younger than 36 years of age. ? You are older than 36 years of age and your health care provider tells you that you are at risk for this type of infection. ? Your sexual activity has changed since you were last screened, and you are at increased risk for chlamydia or gonorrhea. Ask your health care provider if   you are at risk.  Ask your health care provider about whether you are at high risk for HIV. Your health care provider may recommend a prescription medicine to help prevent HIV infection. If you choose to take medicine to prevent HIV, you should first get tested for HIV. You should then be tested every 3 months for as long as you are taking the medicine. Pregnancy  If you are about to stop having your period (premenopausal) and  you may become pregnant, seek counseling before you get pregnant.  Take 400 to 800 micrograms (mcg) of folic acid every day if you become pregnant.  Ask for birth control (contraception) if you want to prevent pregnancy. Osteoporosis and menopause Osteoporosis is a disease in which the bones lose minerals and strength with aging. This can result in bone fractures. If you are 65 years old or older, or if you are at risk for osteoporosis and fractures, ask your health care provider if you should:  Be screened for bone loss.  Take a calcium or vitamin D supplement to lower your risk of fractures.  Be given hormone replacement therapy (HRT) to treat symptoms of menopause. Follow these instructions at home: Lifestyle  Do not use any products that contain nicotine or tobacco, such as cigarettes, e-cigarettes, and chewing tobacco. If you need help quitting, ask your health care provider.  Do not use street drugs.  Do not share needles.  Ask your health care provider for help if you need support or information about quitting drugs. Alcohol use  Do not drink alcohol if: ? Your health care provider tells you not to drink. ? You are pregnant, may be pregnant, or are planning to become pregnant.  If you drink alcohol: ? Limit how much you use to 0-1 drink a day. ? Limit intake if you are breastfeeding.  Be aware of how much alcohol is in your drink. In the [place] one drink equals one 12 oz bottle of beer (355 mL), one 5 oz glass of wine (148 mL), or one 1 oz glass of hard liquor (44 mL). General instructions  Schedule regular health, dental, and eye exams.  Stay current with your vaccines.  Tell your health care provider if: ? You often feel depressed. ? You have ever been abused or do not feel safe at home. Summary  Adopting a healthy lifestyle and getting preventive care are important in promoting health and wellness.  Follow your health care provider's instructions about healthy  diet, exercising, and getting tested or screened for diseases.  Follow your health care provider's instructions on monitoring your cholesterol and blood pressure. This information is not intended to replace advice given to you by your health care provider. Make sure you discuss any questions you have with your health care provider. Document Revised: 11/02/2018 Document Reviewed: 11/02/2018 Elsevier Patient Education  2020 Elsevier Inc[employer]  

## 2020-10-25 DIAGNOSIS — E663 Overweight: Secondary | ICD-10-CM | POA: Diagnosis not present

## 2020-10-25 DIAGNOSIS — Z0000 Encounter for general adult medical examination without abnormal findings: Secondary | ICD-10-CM | POA: Diagnosis not present

## 2020-11-01 DIAGNOSIS — F429 Obsessive-compulsive disorder, unspecified: Secondary | ICD-10-CM | POA: Diagnosis not present

## 2020-11-21 DIAGNOSIS — R058 Other specified cough: Secondary | ICD-10-CM | POA: Diagnosis not present

## 2020-11-21 DIAGNOSIS — U071 COVID-19: Secondary | ICD-10-CM | POA: Diagnosis not present

## 2021-01-10 DIAGNOSIS — F429 Obsessive-compulsive disorder, unspecified: Secondary | ICD-10-CM | POA: Diagnosis not present

## 2021-02-07 DIAGNOSIS — F429 Obsessive-compulsive disorder, unspecified: Secondary | ICD-10-CM | POA: Diagnosis not present

## 2021-02-10 DIAGNOSIS — F429 Obsessive-compulsive disorder, unspecified: Secondary | ICD-10-CM | POA: Diagnosis not present

## 2021-03-04 DIAGNOSIS — B079 Viral wart, unspecified: Secondary | ICD-10-CM | POA: Diagnosis not present

## 2021-03-04 DIAGNOSIS — S70362A Insect bite (nonvenomous), left thigh, initial encounter: Secondary | ICD-10-CM | POA: Diagnosis not present

## 2021-03-04 DIAGNOSIS — B078 Other viral warts: Secondary | ICD-10-CM | POA: Diagnosis not present

## 2021-03-28 DIAGNOSIS — F429 Obsessive-compulsive disorder, unspecified: Secondary | ICD-10-CM | POA: Diagnosis not present

## 2021-04-07 DIAGNOSIS — D1801 Hemangioma of skin and subcutaneous tissue: Secondary | ICD-10-CM | POA: Diagnosis not present

## 2021-04-07 DIAGNOSIS — D225 Melanocytic nevi of trunk: Secondary | ICD-10-CM | POA: Diagnosis not present

## 2021-04-07 DIAGNOSIS — B078 Other viral warts: Secondary | ICD-10-CM | POA: Diagnosis not present

## 2021-04-07 DIAGNOSIS — D2262 Melanocytic nevi of left upper limb, including shoulder: Secondary | ICD-10-CM | POA: Diagnosis not present

## 2021-04-07 DIAGNOSIS — D2261 Melanocytic nevi of right upper limb, including shoulder: Secondary | ICD-10-CM | POA: Diagnosis not present

## 2021-04-25 DIAGNOSIS — F429 Obsessive-compulsive disorder, unspecified: Secondary | ICD-10-CM | POA: Diagnosis not present

## 2021-08-22 DIAGNOSIS — F429 Obsessive-compulsive disorder, unspecified: Secondary | ICD-10-CM | POA: Diagnosis not present

## 2021-10-03 DIAGNOSIS — F429 Obsessive-compulsive disorder, unspecified: Secondary | ICD-10-CM | POA: Diagnosis not present

## 2021-10-20 ENCOUNTER — Other Ambulatory Visit: Payer: Self-pay

## 2021-10-20 ENCOUNTER — Encounter: Payer: Self-pay | Admitting: Nurse Practitioner

## 2021-10-20 ENCOUNTER — Ambulatory Visit (INDEPENDENT_AMBULATORY_CARE_PROVIDER_SITE_OTHER): Payer: BC Managed Care – PPO | Admitting: Nurse Practitioner

## 2021-10-20 ENCOUNTER — Other Ambulatory Visit (HOSPITAL_COMMUNITY)
Admission: RE | Admit: 2021-10-20 | Discharge: 2021-10-20 | Disposition: A | Discharge status: Home / Self Care | Payer: BC Managed Care – PPO | Source: Ambulatory Visit | Attending: Nurse Practitioner | Admitting: Nurse Practitioner

## 2021-10-20 VITALS — BP 120/74 | Ht 63.5 in | Wt 163.0 lb

## 2021-10-20 DIAGNOSIS — Z3041 Encounter for surveillance of contraceptive pills: Secondary | ICD-10-CM

## 2021-10-20 DIAGNOSIS — N920 Excessive and frequent menstruation with regular cycle: Secondary | ICD-10-CM | POA: Diagnosis not present

## 2021-10-20 DIAGNOSIS — Z01419 Encounter for gynecological examination (general) (routine) without abnormal findings: Secondary | ICD-10-CM | POA: Diagnosis not present

## 2021-10-20 DIAGNOSIS — L900 Lichen sclerosus et atrophicus: Secondary | ICD-10-CM | POA: Diagnosis not present

## 2021-10-20 MED ORDER — DROSPIRENONE-ETHINYL ESTRADIOL 3-0.02 MG PO TABS: 1.0000 | ORAL_TABLET | Freq: Every day | ORAL | 3 refills | Status: DC

## 2021-10-20 MED ORDER — CLOBETASOL PROPIONATE 0.05 % EX OINT: 1.0000 "application " | TOPICAL_OINTMENT | CUTANEOUS | 1 refills | Status: DC

## 2021-10-20 NOTE — Progress Notes (Signed)
Patricia Hogan [birth year] [id number]   History:  35 y.o. G0 presents for annual exam. Monthly cycle on OCPs. Over the last 1-2 years she has experienced some cycles being more heavy and painful. We discussed last year and she did not want to change contraception at that time. 2012 LGSIL, subsequent paps normal. Complaints of intermittent vulvar itching for years without discharge or odor.   Gynecologic History Patient's last menstrual period was 10/16/2021. Period Cycle (Days): 28 Period Duration (Days): 4 Period Pattern: Regular Menstrual Flow: Moderate (varies) Dysmenorrhea: (!) Moderate Dysmenorrhea Symptoms: Cramping Contraception/Family planning: OCP (estrogen/progesterone) Sexually active: No  Health Maintenance Last Pap: 09/27/2018. Results were: Normal, 3-year repeat Last mammogram: Not indicated Last colonoscopy: Not indicated Last Dexa: Not indicated  Past medical history, past surgical history, family history and social history were all reviewed and documented in the EPIC chart. Divorced. Hygienist.   ROS:  A ROS was performed and pertinent positives and negatives are included.  Exam:  Vitals:   10/20/21 0857  BP: 120/74  Weight: 163 lb (73.9 kg)  Height: 5' 3.5" (1.613 m)    Body mass index is 28.42 kg/m.  General appearance:  Normal Thyroid:  Symmetrical, normal in size, without palpable masses or nodularity. Respiratory  Auscultation:  Clear without wheezing or rhonchi Cardiovascular  Auscultation:  Regular rate, without rubs, murmurs or gallops  Edema/varicosities:  Not grossly evident Abdominal  Soft,nontender, without masses, guarding or rebound.  Liver/spleen:  No organomegaly noted  Hernia:  None appreciated  Skin  Inspection:  Grossly normal   Breasts: Examined lying and sitting. Bilateral breast implants  Right: Without masses, retractions, discharge or axillary adenopathy.   Left: Without masses, retractions, discharge or axillary  adenopathy. Genitourinary   Inguinal/mons:  Normal without inguinal adenopathy  External genitalia:  White, pearly appearance in figure 8 patter consistent with LS  BUS/Urethra/Skene's glands:  Normal  Vagina:  Normal appearing with normal color and discharge, no lesions  Cervix:  Normal appearing without discharge or lesions  Uterus:  Normal in size, shape and contour.  Midline and mobile, nontender  Adnexa/parametria:     Rt: Normal in size, without masses or tenderness.   Lt: Normal in size, without masses or tenderness.  Anus and perineum: Normal  Patient informed chaperone available to be present for breast and pelvic exam. Patient has requested no chaperone to be present. Patient has been advised what will be completed during breast and pelvic exam.   Assessment/Plan:  35 y.o. G0 for annual exam.   Well female exam with routine gynecological exam - Plan: Cytology - PAP( [place]). Education provided on SBEs, importance of preventative screenings, current guidelines, high calcium diet, regular exercise, and multivitamin daily. Labs with PCP.  Encounter for surveillance of contraceptive pills - Plan: drospirenone-ethinyl estradiol (YAZ) 3-0.02 MG tablet daily. Taking as prescribed. Refill x 1 year provided.   Menorrhagia with regular cycle - Plan: drospirenone-ethinyl estradiol (YAZ) 3-0.02 MG tablet daily. Over the last 1-2 years she has experienced some cycles being more heavy and painful. We discussed last year and she did not want to change contraception at that time. We again discussed this and she wants to continue current OCPs. Recommend Ibuprofen as needed.   Lichen sclerosus - Plan: clobetasol ointment (TEMOVATE) 0.05 %. Education provided on lichen sclerosus and management.   Screening for cervical cancer - 2012 biopsy confirmed LGSIL, subsequent paps normal. Pap today.   Follow up in 1 year for annual.  Olivia Mackie [facility] [place], 9:12 AM 10/20/2021

## 2021-10-21 LAB — CYTOLOGY - PAP
Adequacy: ABSENT
Comment: NEGATIVE
Diagnosis: NEGATIVE
High risk HPV: NEGATIVE

## 2021-10-31 DIAGNOSIS — E611 Iron deficiency: Secondary | ICD-10-CM | POA: Diagnosis not present

## 2021-10-31 DIAGNOSIS — F429 Obsessive-compulsive disorder, unspecified: Secondary | ICD-10-CM | POA: Diagnosis not present

## 2021-10-31 DIAGNOSIS — E781 Pure hyperglyceridemia: Secondary | ICD-10-CM | POA: Diagnosis not present

## 2021-11-07 DIAGNOSIS — R82998 Other abnormal findings in urine: Secondary | ICD-10-CM | POA: Diagnosis not present

## 2021-11-07 DIAGNOSIS — Z1339 Encounter for screening examination for other mental health and behavioral disorders: Secondary | ICD-10-CM | POA: Diagnosis not present

## 2021-11-07 DIAGNOSIS — Z1331 Encounter for screening for depression: Secondary | ICD-10-CM | POA: Diagnosis not present

## 2021-11-07 DIAGNOSIS — E781 Pure hyperglyceridemia: Secondary | ICD-10-CM | POA: Diagnosis not present

## 2021-11-07 DIAGNOSIS — Z0000 Encounter for general adult medical examination without abnormal findings: Secondary | ICD-10-CM | POA: Diagnosis not present

## 2021-11-28 DIAGNOSIS — F429 Obsessive-compulsive disorder, unspecified: Secondary | ICD-10-CM | POA: Diagnosis not present

## 2021-12-09 DIAGNOSIS — F3342 Major depressive disorder, recurrent, in full remission: Secondary | ICD-10-CM | POA: Diagnosis not present

## 2022-01-02 DIAGNOSIS — F3342 Major depressive disorder, recurrent, in full remission: Secondary | ICD-10-CM | POA: Diagnosis not present

## 2022-02-06 DIAGNOSIS — F3342 Major depressive disorder, recurrent, in full remission: Secondary | ICD-10-CM | POA: Diagnosis not present

## 2022-05-01 DIAGNOSIS — H57813 Brow ptosis, bilateral: Secondary | ICD-10-CM | POA: Diagnosis not present

## 2022-05-01 DIAGNOSIS — H04123 Dry eye syndrome of bilateral lacrimal glands: Secondary | ICD-10-CM | POA: Diagnosis not present

## 2022-05-01 DIAGNOSIS — H02834 Dermatochalasis of left upper eyelid: Secondary | ICD-10-CM | POA: Diagnosis not present

## 2022-05-01 DIAGNOSIS — H02831 Dermatochalasis of right upper eyelid: Secondary | ICD-10-CM | POA: Diagnosis not present

## 2022-05-01 DIAGNOSIS — R234 Changes in skin texture: Secondary | ICD-10-CM | POA: Diagnosis not present

## 2022-05-15 ENCOUNTER — Encounter: Payer: Self-pay | Admitting: Radiology

## 2022-05-15 ENCOUNTER — Ambulatory Visit: Payer: BC Managed Care – PPO | Admitting: Radiology

## 2022-05-15 VITALS — BP 118/74 | Temp 98.5°F

## 2022-05-15 DIAGNOSIS — N3091 Cystitis, unspecified with hematuria: Secondary | ICD-10-CM

## 2022-05-15 DIAGNOSIS — R1032 Left lower quadrant pain: Secondary | ICD-10-CM | POA: Diagnosis not present

## 2022-05-15 MED ORDER — NITROFURANTOIN MONOHYD MACRO 100 MG PO CAPS: 100.0000 mg | ORAL_CAPSULE | Freq: Two times a day (BID) | ORAL | 0 refills | Status: DC

## 2022-05-17 LAB — URINALYSIS, COMPLETE W/RFL CULTURE
Bilirubin Urine: NEGATIVE
Glucose, UA: NEGATIVE
Hyaline Cast: NONE SEEN /LPF
Ketones, ur: NEGATIVE
Nitrites, Initial: NEGATIVE
Protein, ur: NEGATIVE
Specific Gravity, Urine: 1.015 (ref 1.001–1.035)
pH: 6.5 (ref 5.0–8.0)

## 2022-05-17 LAB — PREGNANCY, URINE: Preg Test, Ur: NEGATIVE

## 2022-05-17 LAB — URINE CULTURE
MICRO NUMBER:: 13564619
Result:: NO GROWTH
SPECIMEN QUALITY:: ADEQUATE

## 2022-05-17 LAB — CULTURE INDICATED

## 2022-08-13 DIAGNOSIS — F429 Obsessive-compulsive disorder, unspecified: Secondary | ICD-10-CM | POA: Diagnosis not present

## 2022-08-13 DIAGNOSIS — F3342 Major depressive disorder, recurrent, in full remission: Secondary | ICD-10-CM | POA: Diagnosis not present

## 2022-09-11 DIAGNOSIS — Z110 Encounter for screening for intestinal infectious diseases: Secondary | ICD-10-CM | POA: Diagnosis not present

## 2022-09-11 DIAGNOSIS — Z0000 Encounter for general adult medical examination without abnormal findings: Secondary | ICD-10-CM | POA: Diagnosis not present

## 2022-09-23 DIAGNOSIS — M25551 Pain in right hip: Secondary | ICD-10-CM | POA: Diagnosis not present

## 2022-09-23 DIAGNOSIS — M25571 Pain in right ankle and joints of right foot: Secondary | ICD-10-CM | POA: Diagnosis not present

## 2022-09-23 DIAGNOSIS — M1611 Unilateral primary osteoarthritis, right hip: Secondary | ICD-10-CM | POA: Diagnosis not present

## 2022-10-21 ENCOUNTER — Ambulatory Visit: Payer: BC Managed Care – PPO | Admitting: Nurse Practitioner

## 2022-10-22 ENCOUNTER — Ambulatory Visit (INDEPENDENT_AMBULATORY_CARE_PROVIDER_SITE_OTHER): Payer: BC Managed Care – PPO | Admitting: Nurse Practitioner

## 2022-10-22 ENCOUNTER — Encounter: Payer: Self-pay | Admitting: Nurse Practitioner

## 2022-10-22 VITALS — BP 110/80 | HR 66 | Resp 14 | Ht 63.25 in | Wt 164.0 lb

## 2022-10-22 DIAGNOSIS — N763 Subacute and chronic vulvitis: Secondary | ICD-10-CM

## 2022-10-22 DIAGNOSIS — N920 Excessive and frequent menstruation with regular cycle: Secondary | ICD-10-CM

## 2022-10-22 DIAGNOSIS — Z3041 Encounter for surveillance of contraceptive pills: Secondary | ICD-10-CM

## 2022-10-22 DIAGNOSIS — Z01419 Encounter for gynecological examination (general) (routine) without abnormal findings: Secondary | ICD-10-CM | POA: Diagnosis not present

## 2022-10-22 MED ORDER — DROSPIRENONE-ETHINYL ESTRADIOL 3-0.02 MG PO TABS: 1.0000 | ORAL_TABLET | Freq: Every day | ORAL | 3 refills | Status: DC
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
## 2022-10-30 DIAGNOSIS — M25571 Pain in right ankle and joints of right foot: Secondary | ICD-10-CM | POA: Diagnosis not present

## 2022-11-29 DIAGNOSIS — J019 Acute sinusitis, unspecified: Secondary | ICD-10-CM | POA: Diagnosis not present

## 2022-11-29 DIAGNOSIS — R051 Acute cough: Secondary | ICD-10-CM | POA: Diagnosis not present

## 2022-12-04 DIAGNOSIS — E611 Iron deficiency: Secondary | ICD-10-CM | POA: Diagnosis not present

## 2022-12-04 DIAGNOSIS — E781 Pure hyperglyceridemia: Secondary | ICD-10-CM | POA: Diagnosis not present

## 2022-12-04 DIAGNOSIS — F419 Anxiety disorder, unspecified: Secondary | ICD-10-CM | POA: Diagnosis not present

## 2022-12-11 DIAGNOSIS — Z0000 Encounter for general adult medical examination without abnormal findings: Secondary | ICD-10-CM | POA: Diagnosis not present

## 2022-12-11 DIAGNOSIS — E781 Pure hyperglyceridemia: Secondary | ICD-10-CM | POA: Diagnosis not present

## 2022-12-11 DIAGNOSIS — R82998 Other abnormal findings in urine: Secondary | ICD-10-CM | POA: Diagnosis not present

## 2022-12-11 DIAGNOSIS — Z1331 Encounter for screening for depression: Secondary | ICD-10-CM | POA: Diagnosis not present

## 2023-01-22 DIAGNOSIS — F3342 Major depressive disorder, recurrent, in full remission: Secondary | ICD-10-CM | POA: Diagnosis not present

## 2023-01-22 DIAGNOSIS — F429 Obsessive-compulsive disorder, unspecified: Secondary | ICD-10-CM | POA: Diagnosis not present

## 2023-03-19 DIAGNOSIS — F429 Obsessive-compulsive disorder, unspecified: Secondary | ICD-10-CM | POA: Diagnosis not present

## 2023-03-19 DIAGNOSIS — F3342 Major depressive disorder, recurrent, in full remission: Secondary | ICD-10-CM | POA: Diagnosis not present

## 2023-04-15 DIAGNOSIS — F429 Obsessive-compulsive disorder, unspecified: Secondary | ICD-10-CM | POA: Diagnosis not present

## 2023-04-15 DIAGNOSIS — Z5181 Encounter for therapeutic drug level monitoring: Secondary | ICD-10-CM | POA: Diagnosis not present

## 2023-04-15 DIAGNOSIS — F3342 Major depressive disorder, recurrent, in full remission: Secondary | ICD-10-CM | POA: Diagnosis not present

## 2023-04-30 DIAGNOSIS — F3342 Major depressive disorder, recurrent, in full remission: Secondary | ICD-10-CM | POA: Diagnosis not present

## 2023-05-19 DIAGNOSIS — R0981 Nasal congestion: Secondary | ICD-10-CM | POA: Diagnosis not present

## 2023-05-19 DIAGNOSIS — R070 Pain in throat: Secondary | ICD-10-CM | POA: Diagnosis not present

## 2023-05-19 DIAGNOSIS — R509 Fever, unspecified: Secondary | ICD-10-CM | POA: Diagnosis not present

## 2023-05-19 DIAGNOSIS — R051 Acute cough: Secondary | ICD-10-CM | POA: Diagnosis not present

## 2023-05-21 DIAGNOSIS — M791 Myalgia, unspecified site: Secondary | ICD-10-CM | POA: Diagnosis not present

## 2023-05-21 DIAGNOSIS — R5381 Other malaise: Secondary | ICD-10-CM | POA: Diagnosis not present

## 2023-05-21 DIAGNOSIS — R509 Fever, unspecified: Secondary | ICD-10-CM | POA: Diagnosis not present

## 2023-06-11 DIAGNOSIS — R894 Abnormal immunological findings in specimens from other organs, systems and tissues: Secondary | ICD-10-CM | POA: Diagnosis not present

## 2023-06-11 DIAGNOSIS — M2550 Pain in unspecified joint: Secondary | ICD-10-CM | POA: Diagnosis not present

## 2023-06-11 DIAGNOSIS — R0989 Other specified symptoms and signs involving the circulatory and respiratory systems: Secondary | ICD-10-CM | POA: Diagnosis not present

## 2023-06-11 DIAGNOSIS — D72828 Other elevated white blood cell count: Secondary | ICD-10-CM | POA: Diagnosis not present

## 2023-06-14 DIAGNOSIS — J028 Acute pharyngitis due to other specified organisms: Secondary | ICD-10-CM | POA: Diagnosis not present

## 2023-08-19 DIAGNOSIS — J3089 Other allergic rhinitis: Secondary | ICD-10-CM | POA: Diagnosis not present

## 2023-08-19 DIAGNOSIS — B999 Unspecified infectious disease: Secondary | ICD-10-CM | POA: Diagnosis not present

## 2023-08-19 DIAGNOSIS — H1045 Other chronic allergic conjunctivitis: Secondary | ICD-10-CM | POA: Diagnosis not present

## 2023-08-19 DIAGNOSIS — J45998 Other asthma: Secondary | ICD-10-CM | POA: Diagnosis not present

## 2023-08-19 DIAGNOSIS — R052 Subacute cough: Secondary | ICD-10-CM | POA: Diagnosis not present

## 2023-08-31 DIAGNOSIS — B999 Unspecified infectious disease: Secondary | ICD-10-CM | POA: Diagnosis not present

## 2023-09-23 DIAGNOSIS — R059 Cough, unspecified: Secondary | ICD-10-CM | POA: Diagnosis not present

## 2023-09-23 DIAGNOSIS — J0100 Acute maxillary sinusitis, unspecified: Secondary | ICD-10-CM | POA: Diagnosis not present

## 2023-09-23 DIAGNOSIS — J209 Acute bronchitis, unspecified: Secondary | ICD-10-CM | POA: Diagnosis not present

## 2023-09-23 DIAGNOSIS — R0981 Nasal congestion: Secondary | ICD-10-CM | POA: Diagnosis not present

## 2023-10-01 ENCOUNTER — Other Ambulatory Visit: Payer: Self-pay | Admitting: Nurse Practitioner

## 2023-10-01 DIAGNOSIS — N920 Excessive and frequent menstruation with regular cycle: Secondary | ICD-10-CM

## 2023-10-01 DIAGNOSIS — Z3041 Encounter for surveillance of contraceptive pills: Secondary | ICD-10-CM

## 2023-10-01 NOTE — Telephone Encounter (Signed)
Med refill request: Patricia Hogan Last AEX: 10/22/22 Next AEX: 12/16/23 Last MMG (if hormonal med) n/a Refill authorized: Please Advise, #[phone], 0 RF

## 2023-11-05 DIAGNOSIS — H1045 Other chronic allergic conjunctivitis: Secondary | ICD-10-CM | POA: Diagnosis not present

## 2023-11-05 DIAGNOSIS — B999 Unspecified infectious disease: Secondary | ICD-10-CM | POA: Diagnosis not present

## 2023-11-05 DIAGNOSIS — R052 Subacute cough: Secondary | ICD-10-CM | POA: Diagnosis not present

## 2023-11-05 DIAGNOSIS — J3089 Other allergic rhinitis: Secondary | ICD-10-CM | POA: Diagnosis not present

## 2023-12-16 ENCOUNTER — Ambulatory Visit: Payer: BC Managed Care – PPO | Admitting: Nurse Practitioner

## 2023-12-16 VITALS — BP 110/70 | HR 78 | Ht 63.0 in | Wt 179.0 lb

## 2023-12-16 DIAGNOSIS — N920 Excessive and frequent menstruation with regular cycle: Secondary | ICD-10-CM

## 2023-12-16 DIAGNOSIS — Z01419 Encounter for gynecological examination (general) (routine) without abnormal findings: Secondary | ICD-10-CM | POA: Diagnosis not present

## 2023-12-16 DIAGNOSIS — Z3041 Encounter for surveillance of contraceptive pills: Secondary | ICD-10-CM

## 2023-12-16 DIAGNOSIS — Z7689 Persons encountering health services in other specified circumstances: Secondary | ICD-10-CM

## 2023-12-16 MED ORDER — DROSPIRENONE-ETHINYL ESTRADIOL 3-0.02 MG PO TABS: 1.0000 | ORAL_TABLET | Freq: Every day | ORAL | 3 refills | Status: DC

## 2023-12-16 NOTE — Progress Notes (Signed)
Ambyr Catchings Mancias [birth year] [id number]   History:  38 y.o. G0 presents for annual exam. Monthly cycle on OCPs. 2012 LGSIL, subsequent paps normal. H/O chronic vulvitis. Uses clobetasol as needed. Has had some weight gain. Started back on [employer] but has not been successful this time around. Eats very little, not working out.   Gynecologic History Patient's last menstrual period was 10/19/2023. Period Pattern: (!) Irregular Menstrual Flow: Moderate, Heavy Menstrual Control: Tampon Menstrual Control Change Freq (Hours): 2-3 Dysmenorrhea: (!) Moderate Dysmenorrhea Symptoms: Cramping, Headache Contraception/Family planning: OCP (estrogen/progesterone) Sexually active: No  Health Maintenance Last Pap: 10/20/2021. Results were: Normal neg HPV Last mammogram: Never Last colonoscopy: Not indicated Last Dexa: Not indicated  Past medical history, past surgical history, family history and social history were all reviewed and documented in the EPIC chart. 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    ROS:  A ROS was performed and pertinent positives and negatives are included.  Exam:  Vitals:   12/16/23 1422  BP: 110/70  Pulse: 78  SpO2: 100%  Weight: 179 lb (81.2 kg)  Height: 5\' 3"  (1.6 m)      Body mass index is 31.71 kg/m.  General appearance:  Normal Thyroid:  Symmetrical, normal in size, without palpable masses or nodularity. Respiratory  Auscultation:  Clear without wheezing or rhonchi Cardiovascular  Auscultation:  Regular rate, without rubs, murmurs or gallops  Edema/varicosities:  Not grossly evident Abdominal  Soft,nontender, without masses, guarding or rebound.  Liver/spleen:  No organomegaly noted  Hernia:  None appreciated  Skin  Inspection:  Grossly normal   Breasts: Examined lying and sitting. Bilateral breast implants.  Right: Without masses, retractions, discharge or axillary adenopathy.   Left: Without masses, retractions, discharge or axillary  adenopathy.  Pelvic: External genitalia:  no lesions              Urethra:  normal appearing urethra with no masses, tenderness or lesions              Bartholins and Skenes: normal                 Vagina: normal appearing vagina with normal color and discharge, no lesions              Cervix: no lesions Bimanual Exam:  Uterus:  no masses or tenderness              Adnexa: no mass, fullness, tenderness              Rectovaginal: Deferred              Anus:  normal, no lesions  Patient informed chaperone available to be present for breast and pelvic exam. Patient has requested no chaperone to be present. Patient has been advised what will be completed during breast and pelvic exam.   Assessment/Plan:  38 y.o. G0 for annual exam.   Well female exam with routine gynecological exam - Education provided on SBEs, importance of preventative screenings, current guidelines, high calcium diet, regular exercise, and multivitamin daily. Labs with PCP.  Encounter for surveillance of contraceptive pills - Plan: drospirenone-ethinyl estradiol (YAZ) 3-0.02 MG tablet daily. Taking as prescribed. Refill x 1 year provided.   Menorrhagia with regular cycle - Plan: drospirenone-ethinyl estradiol (YAZ) 3-0.02 MG tablet daily with good management.   Encounter for weight management - has not been successful with WW this time. Started in September 2024. Discussed importance of calorie deficit for weight loss, low calorie/high protein diet,  exercise, avoid late night snacking and sugary drinks.   Screening for cervical cancer - 2012 CIN-1, subsequent paps normal. Will repeat at 5-year interval per guidelines.   Screening for breast cancer - Discussed current guidelines and recommendations for annual screenings starting at age 12. Information provided on TBC. Normal breast exam today.   Return in about 1 year (around 12/15/2024) for Annual.    Olivia Mackie [facility], 2:45 PM 12/16/2023

## 2023-12-17 DIAGNOSIS — R059 Cough, unspecified: Secondary | ICD-10-CM | POA: Diagnosis not present

## 2023-12-17 DIAGNOSIS — J309 Allergic rhinitis, unspecified: Secondary | ICD-10-CM | POA: Diagnosis not present

## 2023-12-17 DIAGNOSIS — R0981 Nasal congestion: Secondary | ICD-10-CM | POA: Diagnosis not present

## 2023-12-24 DIAGNOSIS — E781 Pure hyperglyceridemia: Secondary | ICD-10-CM | POA: Diagnosis not present

## 2023-12-24 DIAGNOSIS — E611 Iron deficiency: Secondary | ICD-10-CM | POA: Diagnosis not present

## 2023-12-31 DIAGNOSIS — R82998 Other abnormal findings in urine: Secondary | ICD-10-CM | POA: Diagnosis not present

## 2023-12-31 DIAGNOSIS — E781 Pure hyperglyceridemia: Secondary | ICD-10-CM | POA: Diagnosis not present

## 2023-12-31 DIAGNOSIS — Z23 Encounter for immunization: Secondary | ICD-10-CM | POA: Diagnosis not present

## 2023-12-31 DIAGNOSIS — Z0000 Encounter for general adult medical examination without abnormal findings: Secondary | ICD-10-CM | POA: Diagnosis not present

## 2024-01-14 DIAGNOSIS — H9203 Otalgia, bilateral: Secondary | ICD-10-CM | POA: Diagnosis not present

## 2024-01-14 DIAGNOSIS — R059 Cough, unspecified: Secondary | ICD-10-CM | POA: Diagnosis not present

## 2024-01-14 DIAGNOSIS — R070 Pain in throat: Secondary | ICD-10-CM | POA: Diagnosis not present

## 2024-01-14 DIAGNOSIS — R519 Headache, unspecified: Secondary | ICD-10-CM | POA: Diagnosis not present

## 2024-01-21 DIAGNOSIS — F3342 Major depressive disorder, recurrent, in full remission: Secondary | ICD-10-CM | POA: Diagnosis not present

## 2024-01-24 ENCOUNTER — Other Ambulatory Visit: Payer: Self-pay | Admitting: Nurse Practitioner

## 2024-01-24 DIAGNOSIS — Z1231 Encounter for screening mammogram for malignant neoplasm of breast: Secondary | ICD-10-CM

## 2024-01-27 ENCOUNTER — Ambulatory Visit
Admission: RE | Admit: 2024-01-27 | Discharge: 2024-01-27 | Disposition: A | Discharge status: Home / Self Care | Source: Ambulatory Visit | Attending: Nurse Practitioner | Admitting: Nurse Practitioner

## 2024-01-27 DIAGNOSIS — Z1231 Encounter for screening mammogram for malignant neoplasm of breast: Secondary | ICD-10-CM | POA: Diagnosis not present

## 2024-02-18 DIAGNOSIS — F3342 Major depressive disorder, recurrent, in full remission: Secondary | ICD-10-CM | POA: Diagnosis not present

## 2024-03-16 ENCOUNTER — Ambulatory Visit

## 2024-03-16 DIAGNOSIS — J019 Acute sinusitis, unspecified: Secondary | ICD-10-CM | POA: Diagnosis not present

## 2024-03-16 DIAGNOSIS — R051 Acute cough: Secondary | ICD-10-CM | POA: Diagnosis not present

## 2024-03-16 DIAGNOSIS — R0981 Nasal congestion: Secondary | ICD-10-CM | POA: Diagnosis not present

## 2024-03-17 DIAGNOSIS — F3342 Major depressive disorder, recurrent, in full remission: Secondary | ICD-10-CM | POA: Diagnosis not present

## 2024-05-04 DIAGNOSIS — F3342 Major depressive disorder, recurrent, in full remission: Secondary | ICD-10-CM | POA: Diagnosis not present

## 2024-05-04 DIAGNOSIS — F429 Obsessive-compulsive disorder, unspecified: Secondary | ICD-10-CM | POA: Diagnosis not present

## 2024-05-04 DIAGNOSIS — Z5181 Encounter for therapeutic drug level monitoring: Secondary | ICD-10-CM | POA: Diagnosis not present

## 2024-05-16 DIAGNOSIS — R059 Cough, unspecified: Secondary | ICD-10-CM | POA: Diagnosis not present

## 2024-05-16 DIAGNOSIS — R110 Nausea: Secondary | ICD-10-CM | POA: Diagnosis not present

## 2024-05-16 DIAGNOSIS — K591 Functional diarrhea: Secondary | ICD-10-CM | POA: Diagnosis not present

## 2024-05-16 DIAGNOSIS — R0981 Nasal congestion: Secondary | ICD-10-CM | POA: Diagnosis not present

## 2024-05-19 DIAGNOSIS — F3342 Major depressive disorder, recurrent, in full remission: Secondary | ICD-10-CM | POA: Diagnosis not present

## 2024-06-02 DIAGNOSIS — F3342 Major depressive disorder, recurrent, in full remission: Secondary | ICD-10-CM | POA: Diagnosis not present

## 2024-06-23 DIAGNOSIS — F3342 Major depressive disorder, recurrent, in full remission: Secondary | ICD-10-CM | POA: Diagnosis not present

## 2024-08-10 DIAGNOSIS — F3342 Major depressive disorder, recurrent, in full remission: Secondary | ICD-10-CM | POA: Diagnosis not present

## 2024-09-15 DIAGNOSIS — F3342 Major depressive disorder, recurrent, in full remission: Secondary | ICD-10-CM | POA: Diagnosis not present

## 2024-12-02 ENCOUNTER — Other Ambulatory Visit: Payer: Self-pay | Admitting: Nurse Practitioner

## 2024-12-02 DIAGNOSIS — Z3041 Encounter for surveillance of contraceptive pills: Secondary | ICD-10-CM

## 2024-12-02 DIAGNOSIS — N920 Excessive and frequent menstruation with regular cycle: Secondary | ICD-10-CM

## 2024-12-04 NOTE — Telephone Encounter (Signed)
 Medication refill request: yaz (Patricia Hogan ) 3-0.02mg  Last AEX:  12-16-23 Next AEX: 12-21-24 Last MMG (if hormonal medication request): 01-27-24 birads 1:neg Refill authorized: rx sent to the pharmacy for supply with 0 refills

## 2024-12-21 ENCOUNTER — Encounter: Payer: Self-pay | Admitting: Nurse Practitioner

## 2024-12-21 ENCOUNTER — Ambulatory Visit (INDEPENDENT_AMBULATORY_CARE_PROVIDER_SITE_OTHER): Payer: BC Managed Care – PPO | Admitting: Nurse Practitioner

## 2024-12-21 VITALS — BP 112/74 | HR 73 | Ht 64.25 in | Wt 179.0 lb

## 2024-12-21 DIAGNOSIS — Z7689 Persons encountering health services in other specified circumstances: Secondary | ICD-10-CM | POA: Diagnosis not present

## 2024-12-21 DIAGNOSIS — Z1331 Encounter for screening for depression: Secondary | ICD-10-CM | POA: Diagnosis not present

## 2024-12-21 DIAGNOSIS — N920 Excessive and frequent menstruation with regular cycle: Secondary | ICD-10-CM

## 2024-12-21 DIAGNOSIS — Z6830 Body mass index (BMI) 30.0-30.9, adult: Secondary | ICD-10-CM

## 2024-12-21 DIAGNOSIS — Z3041 Encounter for surveillance of contraceptive pills: Secondary | ICD-10-CM

## 2024-12-21 DIAGNOSIS — Z01419 Encounter for gynecological examination (general) (routine) without abnormal findings: Secondary | ICD-10-CM

## 2024-12-21 MED ORDER — PHENTERMINE HCL 30 MG PO CAPS: ORAL_CAPSULE | ORAL | 0 refills | Status: DC

## 2024-12-21 MED ORDER — DROSPIRENONE-ETHINYL ESTRADIOL 3-0.02 MG PO TABS: 1.0000 | ORAL_TABLET | Freq: Every day | ORAL | 3 refills | Status: AC

## 2024-12-21 NOTE — Progress Notes (Signed)
 "  Patricia Hogan Sep 29, 1986 [id number]   History:  39 y.o. G0 presents for annual exam. Monthly cycle on OCPs. Cycles have changed a little, sometimes heavier with clots. 2012 CIN-1, subsequent paps normal. H/O chronic vulvitis. Uses clobetasol  as needed. Has been struggling with weight loss for a couple of years. Very conscientious of diet, has been doing [employer] with no success. Worked for her in the past. Not exercising. Son has many health issues and she works FT so this is hard to manage right now. Interested in something short-term to help with appetite suppression.   Gynecologic History Patient's last menstrual period was 12/14/2024 (exact date). Period Cycle (Days): 28 Period Duration (Days): 3-7 Period Pattern: Regular Menstrual Flow: Moderate, Heavy (Varies) Menstrual Control: Tampon Dysmenorrhea: (!) Moderate Dysmenorrhea Symptoms: Cramping Contraception/Family planning: OCP (estrogen/progesterone) Sexually active: No  Health Maintenance Last Pap: 10/20/2021. Results were: Normal neg HPV Last mammogram: 01/27/2024. Results were: Normal Last colonoscopy: Not indicated Last Dexa: Not indicated     12/21/2024    2:32 PM  Depression screen PHQ 2/9  Decreased Interest 0  Down, Depressed, Hopeless 0  PHQ - 2 Score 0     Past medical history, past surgical history, family history and social history were all reviewed and documented in the EPIC chart. 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    ROS:  A ROS was performed and pertinent positives and negatives are included.  Exam:  Vitals:   12/21/24 1428  BP: 112/74  Pulse: 73  SpO2: 99%  Weight: 179 lb (81.2 kg)  Height: 5' 4.25 (1.632 m)       Body mass index is 30.49 kg/m.  General appearance:  Normal Thyroid :  Symmetrical, normal in size, without palpable masses or nodularity. Respiratory  Auscultation:  Clear without wheezing or rhonchi Cardiovascular  Auscultation:  Regular rate, without rubs, murmurs or  gallops  Edema/varicosities:  Not grossly evident Abdominal  Soft,nontender, without masses, guarding or rebound.  Liver/spleen:  No organomegaly noted  Hernia:  None appreciated  Skin  Inspection:  Grossly normal   Breasts: Examined lying and sitting. Bilateral breast implants noted.   Right: Without masses, retractions, discharge or axillary adenopathy.   Left: Without masses, retractions, discharge or axillary adenopathy.  Pelvic: External genitalia:  no lesions              Urethra:  normal appearing urethra with no masses, tenderness or lesions              Bartholins and Skenes: normal                 Vagina: normal appearing vagina with normal color and discharge, no lesions              Cervix: no lesions Bimanual Exam:  Uterus:  no masses or tenderness              Adnexa: no mass, fullness, tenderness              Rectovaginal: Deferred              Anus:  normal, no lesions  Kari Leaven, CMA present as chaperone.   Assessment/Plan:  39 y.o. G0 for annual exam.   Well female exam with routine gynecological exam - Education provided on SBEs, importance of preventative screenings, current guidelines, high calcium diet, regular exercise, and multivitamin daily. Labs with PCP.  Encounter for surveillance of contraceptive pills - Plan: drospirenone -ethinyl estradiol  (YAZ) 3-0.02 MG tablet  daily. Taking as prescribed. Refill x 1 year provided.   Menorrhagia with regular cycle - Plan: drospirenone -ethinyl estradiol  (YAZ) 3-0.02 MG tablet daily. Offered ultrasound and/or changing birth control method to help with cycle changes. Not very bothersome, so wants to wait and monitor for now. Has appt with PCP next week, recommend checking TSH with labs.   Depression screening - PHQ - 0  Encounter for weight management - Plan: phentermine  30 MG capsule daily. Take half a tablet first 14 days and increase to full tablet daily if tolerating. Discussed importance of daily calorie deficit.  Recommend increasing exercise. Plans to get a walking pad for home. Educated on Phentermine  potential side effects and short-term use.   BMI 30.0-30.9,adult - Plan: phentermine  30 MG capsule daily. Take half a tablet first 14 days and increase to full tablet daily if tolerating.   Screening for cervical cancer - 2012 CIN-1, subsequent paps normal. Will repeat at 5-year interval per guidelines.   Screening for breast cancer - Continue annual screenings. Normal breast exam.   Return in about 1 year (around 12/21/2025) for Annual.    Patricia Hogan [facility], 3:36 PM 12/21/2024 "

## 2024-12-26 ENCOUNTER — Other Ambulatory Visit: Payer: Self-pay | Admitting: Nurse Practitioner

## 2024-12-26 DIAGNOSIS — Z6830 Body mass index (BMI) 30.0-30.9, adult: Secondary | ICD-10-CM

## 2024-12-26 DIAGNOSIS — Z7689 Persons encountering health services in other specified circumstances: Secondary | ICD-10-CM

## 2024-12-26 MED ORDER — PHENTERMINE HCL 37.5 MG PO TABS: ORAL_TABLET | ORAL | 0 refills | Status: AC
